# Patient Record
Sex: Male | Born: 1990 | Race: Black or African American | Hispanic: No | Marital: Single | Smoking: Former smoker
Health system: Southern US, Community
[De-identification: ages and names within clinical notes are randomized; demographics above are authoritative.]

## PROBLEM LIST (undated history)

## (undated) DIAGNOSIS — F4310 Post-traumatic stress disorder, unspecified: Secondary | ICD-10-CM

## (undated) DIAGNOSIS — F259 Schizoaffective disorder, unspecified: Secondary | ICD-10-CM

## (undated) DIAGNOSIS — G43909 Migraine, unspecified, not intractable, without status migrainosus: Secondary | ICD-10-CM

## (undated) HISTORY — PX: HAND SURGERY: SHX662

---
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
## 2013-04-29 ENCOUNTER — Emergency Department (HOSPITAL_BASED_OUTPATIENT_CLINIC_OR_DEPARTMENT_OTHER)
Admission: EM | Admit: 2013-04-29 | Discharge: 2013-04-29 | Disposition: A | Discharge status: Home / Self Care | Payer: TRICARE For Life (TFL) | Attending: Emergency Medicine | Admitting: Emergency Medicine

## 2013-04-29 ENCOUNTER — Encounter (HOSPITAL_BASED_OUTPATIENT_CLINIC_OR_DEPARTMENT_OTHER): Payer: Self-pay | Admitting: Emergency Medicine

## 2013-04-29 ENCOUNTER — Emergency Department (HOSPITAL_BASED_OUTPATIENT_CLINIC_OR_DEPARTMENT_OTHER): Payer: TRICARE For Life (TFL)

## 2013-04-29 DIAGNOSIS — R42 Dizziness and giddiness: Secondary | ICD-10-CM | POA: Insufficient documentation

## 2013-04-29 DIAGNOSIS — Z79899 Other long term (current) drug therapy: Secondary | ICD-10-CM | POA: Insufficient documentation

## 2013-04-29 DIAGNOSIS — R079 Chest pain, unspecified: Secondary | ICD-10-CM

## 2013-04-29 DIAGNOSIS — R05 Cough: Secondary | ICD-10-CM | POA: Insufficient documentation

## 2013-04-29 DIAGNOSIS — R059 Cough, unspecified: Secondary | ICD-10-CM | POA: Insufficient documentation

## 2013-04-29 DIAGNOSIS — J40 Bronchitis, not specified as acute or chronic: Secondary | ICD-10-CM

## 2013-04-29 DIAGNOSIS — Z8679 Personal history of other diseases of the circulatory system: Secondary | ICD-10-CM | POA: Insufficient documentation

## 2013-04-29 HISTORY — DX: Migraine, unspecified, not intractable, without status migrainosus: G43.909

## 2013-04-29 LAB — CBC
HCT: 45.2 % (ref 39.0–52.0)
Hemoglobin: 16 g/dL (ref 13.0–17.0)
MCH: 30.8 pg (ref 26.0–34.0)
MCHC: 35.4 g/dL (ref 30.0–36.0)
MCV: 86.9 fL (ref 78.0–100.0)
Platelets: 249 10*3/uL (ref 150–400)
RBC: 5.2 MIL/uL (ref 4.22–5.81)
RDW: 12.9 % (ref 11.5–15.5)
WBC: 10.3 10*3/uL (ref 4.0–10.5)

## 2013-04-29 LAB — BASIC METABOLIC PANEL
BUN: 11 mg/dL (ref 6–23)
CO2: 25 mEq/L (ref 19–32)
Creatinine, Ser: 1 mg/dL (ref 0.50–1.35)
GFR calc non Af Amer: >90 mL/min (ref >90)
Glucose, Bld: 82 mg/dL (ref 70–99)
Potassium: 3.8 mEq/L (ref 3.5–5.1)
Sodium: 140 mEq/L (ref 135–145)

## 2013-04-29 LAB — D-DIMER, QUANTITATIVE: D-Dimer, Quant: <0.27 ug/mL-FEU (ref 0.00–0.48)

## 2013-04-29 MED ORDER — ALBUTEROL SULFATE HFA 108 (90 BASE) MCG/ACT IN AERS: 2.0000 | INHALATION_SPRAY | RESPIRATORY_TRACT | Status: DC | PRN

## 2013-04-29 MED ORDER — IBUPROFEN 800 MG PO TABS
800.0000 mg | ORAL_TABLET | Freq: Once | ORAL | Status: AC
  Administered 2013-04-29: 800 mg via ORAL
  Filled 2013-04-29: qty 1

## 2013-04-29 NOTE — ED Notes (Addendum)
Pt c/o left upper chest pain and left upper arm pain described as "burning" since 5pm after lifting boxes and coughing. Pt reports coughing up "blood" today. Pt reports h/o chest pain. EKG obtained in triage.

## 2013-04-29 NOTE — ED Provider Notes (Signed)
CSN: [id number]     Arrival date & time 04/29/13  4098 History  This chart was scribed for Ethelda Chick, MD by Joaquin Music, ED Scribe. This patient was seen in room MH12/MH12 and the patient's care was started at 7:04 PM  Chief Complaint  Patient presents with  . Chest Pain   Patient is a 22 y.o. male presenting with chest pain. The history is provided by the patient. No language interpreter was used.  Chest Pain Pain location:  L chest Pain quality: aching, radiating, stabbing, throbbing and tightness   Pain radiates to:  L arm Pain radiates to the back: no   Pain severity:  Moderate Onset quality:  Sudden Duration:  1 week Timing:  Sporadic Progression:  Worsening Chronicity:  New Context: not breathing, no drug use, no stress and no trauma   Relieved by: Water. Worsened by:  Nothing tried Ineffective treatments:  None tried Associated symptoms: cough and dizziness   Associated symptoms: no abdominal pain, no back pain, no fever, no headache, no numbness, no shortness of breath and no weakness   Risk factors: no birth control, no diabetes mellitus, no high cholesterol and no hypertension    HPI Comments: Justin Mosley is a 22 y.o. male with a hx of CP who presents to the Emergency Department complaining of ongoing chest pain for the past week but has worsened today and radiates down L arm. Pt states "he feels like electricity is running through his body". Pt states when the pain began, he was doing minimal work. He states his CP today felt "like something being tugged on". He rates his current pain 5/10. Pt states drinking water gives him some relief. He states he began coughing about 2-3 days ago and reports seeing blood in sputum today. He states he has had previous episodes of CP but has never been diagnosed. He states he was given Prilosec for his last episode and reports having minimal relief. Pt denies having fever and abd pain.Pt will be getting a PCP soon.Pt  denies taking OTC medications.  Pt states he recently moved and traveled by car for over 13 hours.   Past Medical History  Diagnosis Date  . Migraine    History reviewed. No pertinent past surgical history. No family history on file. History  Substance Use Topics  . Smoking status: Never Smoker   . Smokeless tobacco: Not on file  . Alcohol Use: No    Review of Systems  Constitutional: Negative for fever.  Respiratory: Positive for cough. Negative for shortness of breath.   Cardiovascular: Positive for chest pain.  Gastrointestinal: Negative for abdominal pain.  Musculoskeletal: Negative for back pain.  Neurological: Positive for dizziness. Negative for weakness, numbness and headaches.  All other systems reviewed and are negative.    Allergies  Review of patient's allergies indicates no known allergies.  Home Medications   Current Outpatient Rx  Name  Route  Sig  Dispense  Refill  . albuterol (PROVENTIL HFA;VENTOLIN HFA) 108 (90 BASE) MCG/ACT inhaler   Inhalation   Inhale 2 puffs into the lungs every 4 (four) hours as needed for wheezing or shortness of breath.   1 Inhaler   0     Triage Vitals:BP 128/77  Pulse 68  Temp(Src) 98.5 F (36.9 C) (Oral)  Resp 18  Ht 5\' 6"  (1.676 m)  Wt 140 lb (63.504 kg)  BMI 22.61 kg/m2  SpO2 100%  Physical Exam  Constitutional: He is oriented to person, place, and  time. He appears well-developed and well-nourished.  HENT:  Head: Normocephalic and atraumatic.  Right Ear: External ear normal.  Left Ear: External ear normal.  Mouth/Throat: Oropharynx is clear and moist.  Eyes: Pupils are equal, round, and reactive to light.  Cardiovascular: Normal rate, regular rhythm and normal heart sounds.   Pulmonary/Chest: Effort normal.  Musculoskeletal: He exhibits no edema.  No leg swelling.  Neurological: He is alert and oriented to person, place, and time.  Note- lungs CTA, no wheezes/rhonchi/rales Chest- no reproducible chest  tenderness, normal pulses ED Course  Procedures  DIAGNOSTIC STUDIES: Oxygen Saturation is 100% on RA, normal by my interpretation.    COORDINATION OF CARE: 7:06 PM-Discussed treatment plan which includes CXR, CBC, D-dimer, and EKG. Pt agreed to plan.   Labs Review Labs Reviewed  CBC  D-DIMER, QUANTITATIVE  BASIC METABOLIC PANEL   Imaging Review Dg Chest 2 View  04/29/2013   CLINICAL DATA:  Left-sided chest pain for 1 week  EXAM: CHEST  2 VIEW  COMPARISON:  None.  FINDINGS: The heart size and mediastinal contours are within normal limits. Both lungs are clear. The visualized skeletal structures are unremarkable.  IMPRESSION: No active cardiopulmonary disease.   Electronically Signed   By: Esperanza Heir M.D.   On: 04/29/2013 19:27    EKG Interpretation    Date/Time:  Monday April 29 2013 18:39:26 EST Ventricular Rate:  66 PR Interval:  116 QRS Duration: 74 QT Interval:  382 QTC Calculation: 400 R Axis:   25 Text Interpretation:  Normal sinus rhythm Normal ECG No old tracing to compare Confirmed by [facility]  MD, Micki Cassel [phone]) on 04/29/2013 11:21:51 PM            MDM   1. Chest pain   2. Bronchitis    Pt presenting with c/o chest pain and cough productive of small amount of blood.  EKG and CXR are both reassuring, CXR images reviewed and interpreted by me.  Will give albuterol MDI for bronchitis.  Discharged with strict return precautions.  Pt agreeable with plan.  I personally performed the services described in this documentation, which was scribed in my presence. The recorded information has been reviewed and is accurate.    Ethelda Chick, MD 05/01/13 [phone]

## 2013-04-29 NOTE — ED Notes (Signed)
Patient transported to X-ray 

## 2013-05-12 ENCOUNTER — Emergency Department (HOSPITAL_COMMUNITY)
Admission: EM | Admit: 2013-05-12 | Discharge: 2013-05-12 | Disposition: A | Discharge status: Home / Self Care | Payer: TRICARE For Life (TFL) | Attending: Emergency Medicine | Admitting: Emergency Medicine

## 2013-05-12 ENCOUNTER — Encounter (HOSPITAL_COMMUNITY): Payer: Self-pay | Admitting: Emergency Medicine

## 2013-05-12 ENCOUNTER — Emergency Department (HOSPITAL_COMMUNITY): Payer: TRICARE For Life (TFL)

## 2013-05-12 DIAGNOSIS — R079 Chest pain, unspecified: Secondary | ICD-10-CM

## 2013-05-12 DIAGNOSIS — R072 Precordial pain: Secondary | ICD-10-CM | POA: Insufficient documentation

## 2013-05-12 DIAGNOSIS — Z8679 Personal history of other diseases of the circulatory system: Secondary | ICD-10-CM | POA: Insufficient documentation

## 2013-05-12 MED ORDER — IBUPROFEN 400 MG PO TABS
600.0000 mg | ORAL_TABLET | Freq: Once | ORAL | Status: AC
  Administered 2013-05-12: 600 mg via ORAL
  Filled 2013-05-12 (×2): qty 1

## 2013-05-12 NOTE — ED Notes (Addendum)
Pt c/o left sided chest pain x 1 1/2 months. Pt has had a cough for past couple of weeks, pt was told he had bronchitis. Pt talking in complete sentences without difficulty. Pt also c/o insomnia for past couple of months that is causing a frontal headache.

## 2013-05-12 NOTE — ED Notes (Signed)
Patient discharged to home with family. NAD.  

## 2013-05-12 NOTE — ED Provider Notes (Signed)
CSN: [id number]     Arrival date & time 05/12/13  1459 History   First MD Initiated Contact with Patient 05/12/13 1603     Chief Complaint  Patient presents with  . Chest Pain   (Consider location/radiation/quality/duration/timing/severity/associated sxs/prior Treatment) HPI Comments: Seen multiple doctors for this chest pain previously, no answers provided throughout his childhood.  Patient is a 22 y.o. male presenting with chest pain. The history is provided by the patient.  Chest Pain Pain location:  Substernal area and L chest Pain quality: aching and dull   Pain radiates to:  Does not radiate Pain radiates to the back: no   Pain severity:  Mild Onset quality:  Gradual Timing:  Intermittent Progression:  Unchanged Chronicity:  Chronic (been present since he was a kid) Context: not breathing and no drug use   Relieved by:  Nothing Worsened by:  Nothing tried Associated symptoms: no abdominal pain, no cough, no fever, no shortness of breath and not vomiting     Past Medical History  Diagnosis Date  . Migraine    Past Surgical History  Procedure Laterality Date  . Hand surgery     No family history on file. History  Substance Use Topics  . Smoking status: Never Smoker   . Smokeless tobacco: Not on file  . Alcohol Use: No    Review of Systems  Constitutional: Negative for fever.  Respiratory: Negative for cough and shortness of breath.   Cardiovascular: Positive for chest pain.  Gastrointestinal: Negative for vomiting and abdominal pain.  All other systems reviewed and are negative.    Allergies  Review of patient's allergies indicates no known allergies.  Home Medications   Current Outpatient Rx  Name  Route  Sig  Dispense  Refill  . albuterol (PROVENTIL HFA;VENTOLIN HFA) 108 (90 BASE) MCG/ACT inhaler   Inhalation   Inhale 1-2 puffs into the lungs every 6 (six) hours as needed for wheezing or shortness of breath.         Marland Kitchen  aspirin-acetaminophen-caffeine (EXCEDRIN MIGRAINE) 250-250-65 MG per tablet   Oral   Take 2 tablets by mouth every 6 (six) hours as needed for headache.          BP 155/97  Pulse 81  Temp(Src) 98.2 F (36.8 C) (Oral)  Resp 18  Ht 5\' 6"  (1.676 m)  Wt 140 lb (63.504 kg)  BMI 22.61 kg/m2  SpO2 100% Physical Exam  Nursing note and vitals reviewed. Constitutional: He is oriented to person, place, and time. He appears well-developed and well-nourished. No distress.  HENT:  Head: Normocephalic and atraumatic.  Mouth/Throat: No oropharyngeal exudate.  Eyes: EOM are normal. Pupils are equal, round, and reactive to light.  Neck: Normal range of motion. Neck supple.  Cardiovascular: Normal rate and regular rhythm.  Exam reveals no friction rub.   No murmur heard. Pulmonary/Chest: Effort normal and breath sounds normal. No respiratory distress. He has no wheezes. He has no rales. He exhibits no tenderness.  Abdominal: He exhibits no distension. There is no tenderness. There is no rebound.  Musculoskeletal: Normal range of motion. He exhibits no edema.  Neurological: He is alert and oriented to person, place, and time.  Skin: He is not diaphoretic.    ED Course  Procedures (including critical care time) Labs Review Labs Reviewed - No data to display Imaging Review No results found.  EKG Interpretation    Date/Time:  Sunday May 12 2013 15:04:01 EST Ventricular Rate:  70 PR Interval:  116 QRS Duration: 72 QT Interval:  340 QTC Calculation: 367 R Axis:   32 Text Interpretation:  Normal sinus rhythm Normal ECG Confirmed by Gwendolyn Grant  MD, Jerami Tammen ([id number]) on 05/12/2013 4:04:49 PM            MDM   1. Chest pain    83M here with chest pain. Multiple visits for same. He states he's had this chest pain since he was a kid. He's been on Prilosec with no relief. He occasionally takes ibuprofen without relief. He describes as a central dull ache. He states it does get better when  he walks around he eats. Here vitals are stable, is relaxing currently in sinus rhythm on the monitor and normal pulse oximeter 100% on room air. Exam is benign. No chest wall tenderness. He is nontoxic. He was diagnosed with bronchitis when he presented with this a few weeks ago. His cough is decreased since then. I do not think he has bronchitis today. We will check his chest x-ray. EKG is normal. He is also complaining of seat problems. He reports some thought-racing and an inability to go to sleep at night. I recommended good sleep hygiene and starting a sleep routine and also try low-dose Benadryl. CXR normal. Stable for discharge.  I have reviewed all labs and imaging and considered them in my medical decision making.   Dagmar Hait, MD 05/12/13 [phone]

## 2013-05-12 NOTE — ED Notes (Signed)
Patient transported to X-ray 

## 2014-05-19 ENCOUNTER — Emergency Department (HOSPITAL_BASED_OUTPATIENT_CLINIC_OR_DEPARTMENT_OTHER)
Admission: EM | Admit: 2014-05-19 | Discharge: 2014-05-19 | Disposition: A | Discharge status: Home / Self Care | Payer: Managed Care, Other (non HMO) | Attending: Emergency Medicine | Admitting: Emergency Medicine

## 2014-05-19 ENCOUNTER — Encounter (HOSPITAL_BASED_OUTPATIENT_CLINIC_OR_DEPARTMENT_OTHER): Payer: Self-pay | Admitting: *Deleted

## 2014-05-19 DIAGNOSIS — Z8679 Personal history of other diseases of the circulatory system: Secondary | ICD-10-CM | POA: Insufficient documentation

## 2014-05-19 DIAGNOSIS — G4700 Insomnia, unspecified: Secondary | ICD-10-CM | POA: Diagnosis not present

## 2014-05-19 DIAGNOSIS — Z79899 Other long term (current) drug therapy: Secondary | ICD-10-CM | POA: Insufficient documentation

## 2014-05-19 NOTE — Discharge Instructions (Signed)
You may try to take melatonin over-the-counter. Follow-up with one of the resources below to establish care with a primary care physician.  RESOURCE GUIDE  Chronic Pain Problems: Contact [facility]  [phone] Patients need to be referred by their primary care doctor.  Insufficient Money for Medicine: Contact [employer]:  call "211."   No Primary Care Doctor: - Call [facility]  [phone] - can help you locate a primary care doctor that  accepts your insurance, provides certain services, etc. - Physician Referral Service- [phone]  Agencies that provide inexpensive medical care: - [facility]  [phone] - [facility]  [phone] - [facility]  [phone] - [facility]  [phone] - [facility]  [phone] - [facility]  [phone]  Medicaid-accepting [facility] Providers: - [facility]- [address]  [phone], Mon-Fri 9am-7pm, Sat 9am-1pm - [facility]- [address] [place], [address] [place]  [phone] - [facility]- [address] [place]  [phone] [facility]- [address]  [phone] - Renaye Rakers- [address] [place], [address], [phone]  Only accepts [place] patients after they have their name  applied to their card  Self Pay (no insurance) in [place]: - Sickle Cell Patients: Dr Willey Blade, [facility]  [address] [place], [phone] - [facility]- [address] [place]  [phone]       [facility] [place]- [address]       -     [facility]- see information above (Speak to [employer] if you do not have insurance)       -  [facility]- [address],  [phone]       -  [facility]- [address], [phone]       -  Dr Julio Sicks-  [address], [place],  [phone]       -  [facility] - [address] [phone]       -  [facility]- [address] [phone], also [address] [phone]       -    [facility]- [address] [place], [phone], 1st & 3rd Saturday        every month, 10am-1pm  1) Find a Doctor and Pay Out of Pocket Although you won't have to find out who is covered by your insurance plan, it is a good idea to ask around and get recommendations. You will then need to call the office and see if the doctor you have chosen will accept you as a new patient and what types of options they offer for patients who are self-pay. Some doctors offer discounts or will set up payment plans for their patients who do not have insurance, but you will need to ask so you aren't surprised when you get to your appointment.  2) Contact Your Local Health Department Not all health departments have doctors that can see patients for sick visits, but many do, so it is worth a call to see if yours does. If you don't know where your local health department is, you can check in your phone book. The CDC also has a tool to help you locate your  state's health department, and many state websites also have listings of all of their local health departments.  3) Find a Walk-in Clinic If your illness is not likely to be very severe or complicated, you may want to try a walk in clinic. These are popping up all over the country in pharmacies, drugstores, and shopping centers. They're usually staffed by nurse practitioners or physician assistants that have been trained to treat common illnesses and complaints. They're usually fairly quick and inexpensive. However, if you have serious medical issues or chronic medical problems, these are probably not your best option  Insomnia Insomnia is frequent trouble falling and/or staying asleep. Insomnia can be a long term problem or a short term problem. Both are common.  Insomnia can be a short term problem when the wakefulness is related to a certain stress or worry. Long term insomnia is often related to ongoing stress during waking hours and/or poor sleeping habits. Overtime, sleep deprivation itself can make the problem worse. Every little thing feels more severe because you are overtired and your ability to cope is decreased. CAUSES   Stress, anxiety, and depression.  Poor sleeping habits.  Distractions such as TV in the bedroom.  Naps close to bedtime.  Engaging in emotionally charged conversations before bed.  Technical reading before sleep.  Alcohol and other sedatives. They may make the problem worse. They can hurt normal sleep patterns and normal dream activity.  Stimulants such as caffeine for several hours prior to bedtime.  Pain syndromes and shortness of breath can cause insomnia.  Exercise late at night.  Changing time zones may cause sleeping problems (jet lag). It is sometimes helpful to have someone observe your sleeping patterns. They should look for periods of not breathing during the night (sleep apnea). They should also look to see how long those periods last. If you live alone or observers are uncertain, you can also be observed at a sleep clinic where your sleep patterns will be professionally monitored. Sleep apnea requires a checkup and treatment. Give your caregivers your medical history. Give your caregivers observations your family has made about your sleep.  SYMPTOMS   Not feeling rested in the morning.  Anxiety and restlessness at bedtime.  Difficulty falling and staying asleep. TREATMENT   Your caregiver may prescribe treatment for an underlying medical disorders. Your caregiver can give advice or help if you are using alcohol or other drugs for self-medication. Treatment of underlying problems will usually eliminate insomnia problems.  Medications can be prescribed for short time use. They are generally not  recommended for lengthy use.  Over-the-counter sleep medicines are not recommended for lengthy use. They can be habit forming.  You can promote easier sleeping by making lifestyle changes such as:  Using relaxation techniques that help with breathing and reduce muscle tension.  Exercising earlier in the day.  Changing your diet and the time of your last meal. No night time snacks.  Establish a regular time to go to bed.  Counseling can help with stressful problems and worry.  Soothing music and white noise may be helpful if there are background noises you cannot remove.  Stop tedious detailed work at least one hour before bedtime. HOME CARE INSTRUCTIONS   Keep a diary. Inform your caregiver about your progress. This includes any medication side effects. See your caregiver regularly. Take note of:  Times when you are asleep.  Times when you are awake during the night.  The quality of your sleep.  How  you feel the next day. This information will help your caregiver care for you.  Get out of bed if you are still awake after 15 minutes. Read or do some quiet activity. Keep the lights down. Wait until you feel sleepy and go back to bed.  Keep regular sleeping and waking hours. Avoid naps.  Exercise regularly.  Avoid distractions at bedtime. Distractions include watching television or engaging in any intense or detailed activity like attempting to balance the household checkbook.  Develop a bedtime ritual. Keep a familiar routine of bathing, brushing your teeth, climbing into bed at the same time each night, listening to soothing music. Routines increase the success of falling to sleep faster.  Use relaxation techniques. This can be using breathing and muscle tension release routines. It can also include visualizing peaceful scenes. You can also help control troubling or intruding thoughts by keeping your mind occupied with boring or repetitive thoughts like the old concept of  counting sheep. You can make it more creative like imagining planting one beautiful flower after another in your backyard garden.  During your day, work to eliminate stress. When this is not possible use some of the previous suggestions to help reduce the anxiety that accompanies stressful situations. MAKE SURE YOU:   Understand these instructions.  Will watch your condition.  Will get help right away if you are not doing well or get worse. Document Released: 05/13/2000 Document Revised: 08/08/2011 Document Reviewed: 06/13/2007 [facility] Patient Information 2015 [employer] [place]. This information is not intended to replace advice given to you by your health care provider. Make sure you discuss any questions you have with your health care provider.

## 2014-05-19 NOTE — ED Notes (Signed)
Unable to sleep for the past 2 months.

## 2014-05-19 NOTE — ED Provider Notes (Signed)
CSN: [id number]     Arrival date & time 05/19/14  1133 History   First MD Initiated Contact with Patient 05/19/14 1151     Chief Complaint  Patient presents with  . Insomnia     (Consider location/radiation/quality/duration/timing/severity/associated sxs/prior Treatment) HPI Comments: 23 year old male presenting with insomnia for the past month and a half. Patient reports he's been dealing with insomnia for many years, however it's been worsening over the past month and a half. He states he will follow asleep when he is tired, however immediately wake up. When he does sleep, he gets bad nightmares. He states this is beginning to affect his daily work. States he has been doing with slight depression over the past 2 years, however denies suicidal or homicidal ideations. He does not have a PCP. He mentioned his insomnia at a prior ED visit, was told to take a low-dose Benadryl at the time. He states Benadryl does not help. He has tried taking melatonin in the past which helps for short while, however his body then "became immune to it". Denies fever, chills, chest pain, sob.  The history is provided by the patient.    Past Medical History  Diagnosis Date  . Migraine    Past Surgical History  Procedure Laterality Date  . Hand surgery     No family history on file. History  Substance Use Topics  . Smoking status: Never Smoker   . Smokeless tobacco: Not on file  . Alcohol Use: No    Review of Systems  10 Systems reviewed and are negative for acute change except as noted in the HPI.  Allergies  Review of patient's allergies indicates no known allergies.  Home Medications   Prior to Admission medications   Medication Sig Start Date End Date Taking? Authorizing Provider  albuterol (PROVENTIL HFA;VENTOLIN HFA) 108 (90 BASE) MCG/ACT inhaler Inhale 1-2 puffs into the lungs every 6 (six) hours as needed for wheezing or shortness of breath.    Historical Provider, MD   aspirin-acetaminophen-caffeine (EXCEDRIN MIGRAINE) [phone] MG per tablet Take 2 tablets by mouth every 6 (six) hours as needed for headache.    Historical Provider, MD   BP 152/95 mmHg  Pulse 83  Temp(Src) 98 F (36.7 C) (Oral)  Resp 20  Ht 5\' 6"  (1.676 m)  Wt 140 lb (63.504 kg)  BMI 22.61 kg/m2  SpO2 100% Physical Exam  Constitutional: He is oriented to person, place, and time. He appears well-developed and well-nourished. No distress.  HENT:  Head: Normocephalic and atraumatic.  Mouth/Throat: Oropharynx is clear and moist.  Eyes: Conjunctivae are normal.  Neck: Normal range of motion. Neck supple.  Cardiovascular: Normal rate, regular rhythm and normal heart sounds.   Pulmonary/Chest: Effort normal and breath sounds normal.  Musculoskeletal: Normal range of motion. He exhibits no edema.  Neurological: He is alert and oriented to person, place, and time.  Skin: Skin is warm and dry. He is not diaphoretic.  Psychiatric: He has a normal mood and affect. His behavior is normal.  Nursing note and vitals reviewed.   ED Course  Procedures (including critical care time) Labs Review Labs Reviewed - No data to display  Imaging Review No results found.   EKG Interpretation None      MDM   Final diagnoses:  Insomnia   Patient in no apparent distress. He has been dealing with insomnia for many years. I advised him to try melatonin. Resources given for PCP follow-up for further discussion. Stable for discharge.  Return precautions given. Patient states understanding of treatment care plan and is agreeable.  Kathrynn SpeedRobyn M Addisyn Leclaire, PA-C 05/19/14 1218  Geoffery Lyonsouglas Delo, MD 05/19/14 [phone]

## 2018-03-31 ENCOUNTER — Encounter (HOSPITAL_COMMUNITY): Payer: Self-pay | Admitting: *Deleted

## 2018-03-31 ENCOUNTER — Emergency Department (HOSPITAL_COMMUNITY)
Admission: EM | Admit: 2018-03-31 | Discharge: 2018-03-31 | Disposition: A | Discharge status: Home / Self Care | Payer: Managed Care, Other (non HMO) | Attending: Emergency Medicine | Admitting: Emergency Medicine

## 2018-03-31 DIAGNOSIS — R0789 Other chest pain: Secondary | ICD-10-CM | POA: Insufficient documentation

## 2018-03-31 DIAGNOSIS — R45851 Suicidal ideations: Secondary | ICD-10-CM | POA: Insufficient documentation

## 2018-03-31 LAB — I-STAT TROPONIN, ED: Troponin i, poc: 0 ng/mL (ref 0.00–0.08)

## 2018-03-31 NOTE — ED Provider Notes (Signed)
[facility]   CSN: [id number] Arrival date & time: 03/31/18  1624     History   Chief Complaint Chief Complaint  Patient presents with  . Suicidal  . Chest Pain    HPI Justin Mosley is a 27 y.o. male.  27 year old male history of psychiatric disease and migraines here after he presented to [facility] on an IVC.  States he was thinking about hurting himself.  He tells me he is looking to get back on his psychiatric medicines.  At [facility] he complained of some sharp stabbing chest pain and so they transferred him here for evaluation.  It sounds like they want him back to [facility] after we medically clear him.  As far as the chest pain he says is been going on since he was about 27 years old, last a few seconds at a time and does not seem to be related to anything that he does.  He denies any cocaine.  No family history of early cardiac death.  He denies any chest pain currently.  The history is provided by the patient.  Chest Pain   This is a chronic problem. Episode onset: 18 years ago. The problem occurs daily. The problem has not changed since onset.Associated with: unknown. The pain is present in the substernal region. The pain is at a severity of 1/10. The quality of the pain is described as brief and sharp. The pain does not radiate. Duration of episode(s) is 4 seconds. Pertinent negatives include no abdominal pain, no back pain, no cough, no diaphoresis, no dizziness, no exertional chest pressure, no fever, no headaches, no hemoptysis, no nausea, no shortness of breath and no vomiting. He has tried nothing for the symptoms. Risk factors include male gender and smoking/tobacco exposure.  Pertinent negatives for past medical history include no CAD and no PE.  Pertinent negatives for family medical history include: no early MI.    Past Medical History:  Diagnosis Date  . Migraine     There are no active problems to display for this  patient.   Past Surgical History:  Procedure Laterality Date  . HAND SURGERY          Home Medications    Prior to Admission medications   Medication Sig Start Date End Date Taking? Authorizing Provider  albuterol (PROVENTIL HFA;VENTOLIN HFA) 108 (90 BASE) MCG/ACT inhaler Inhale 1-2 puffs into the lungs every 6 (six) hours as needed for wheezing or shortness of breath.    [provider]  aspirin-acetaminophen-caffeine (EXCEDRIN MIGRAINE) [phone] MG per tablet Take 2 tablets by mouth every 6 (six) hours as needed for headache.    [provider]    Family History No family history on file.  Social History Social History   Tobacco Use  . Smoking status: Never Smoker  Substance Use Topics  . Alcohol use: No  . Drug use: No     Allergies   Patient has no known allergies.   Review of Systems Review of Systems  Constitutional: Negative for diaphoresis and fever.  HENT: Negative for sore throat.   Eyes: Negative for visual disturbance.  Respiratory: Negative for cough, hemoptysis and shortness of breath.   Cardiovascular: Positive for chest pain.  Gastrointestinal: Negative for abdominal pain, nausea and vomiting.  Genitourinary: Negative for dysuria.  Musculoskeletal: Negative for back pain.  Skin: Negative for rash.  Neurological: Negative for dizziness and headaches.     Physical Exam Updated Vital Signs BP Marland Kitchen)  147/100 (BP Location: Left Arm)   Pulse 65   Temp 98 F (36.7 C) (Oral)   Resp 18   SpO2 100%   Physical Exam  Constitutional: He appears well-developed and well-nourished.  HENT:  Head: Normocephalic and atraumatic.  Eyes: Conjunctivae are normal.  Neck: Neck supple.  Cardiovascular: Normal rate, regular rhythm and normal pulses.  No murmur heard. Pulmonary/Chest: Effort normal and breath sounds normal. No respiratory distress.  Abdominal: Soft. There is no tenderness.  Musculoskeletal: He exhibits no edema.       Right  lower leg: He exhibits no tenderness and no edema.       Left lower leg: He exhibits no tenderness and no edema.  Neurological: He is alert.  Skin: Skin is warm and dry. Capillary refill takes less than 2 seconds.  Psychiatric: He has a normal mood and affect.  Nursing note and vitals reviewed.    ED Treatments / Results  Labs (all labs ordered are listed, but only abnormal results are displayed) Labs Reviewed  I-STAT TROPONIN, ED    EKG EKG Interpretation  Date/Time:  Saturday March 31 2018 16:37:26 EDT Ventricular Rate:  67 PR Interval:    QRS Duration: 82 QT Interval:  394 QTC Calculation: 416 R Axis:   39 Text Interpretation:  Sinus rhythm ST elev, probable normal early repol pattern similar pattern to prior 12/14 Confirmed by Meridee Score [phone]) on 03/31/2018 4:42:37 PM   Radiology No results found.  Procedures Procedures (including critical care time)  Medications Ordered in ED Medications - No data to display   Initial Impression / Assessment and Plan / ED Course  I have reviewed the triage vital signs and the nursing notes.  Pertinent labs & imaging results that were available during my care of the patient were reviewed by me and considered in my medical decision making (see chart for details).  Clinical Course as of Apr 02 1255  Sat Mar 31, 2018  1710 Patient's troponin is 0.00.  His pain is very atypical and is been going on a long time.  His blood pressure was slightly elevated on arrival but is normalized since his been here.  I do not see any reason why he could not be returned back to [facility] to have an evaluation for suicidal ideation.  Patient is medically cleared.   [MB]    Clinical Course User Index [MB] Terrilee Files, MD     Final Clinical Impressions(s) / ED Diagnoses   Final diagnoses:  Suicidal ideation  Atypical chest pain    ED Discharge Orders    None       Terrilee Files, MD 04/01/18 1256

## 2018-03-31 NOTE — ED Notes (Signed)
Bed: [facility]PT3 Expected date:  Expected time:  Means of arrival:  Comments: 

## 2018-03-31 NOTE — ED Notes (Signed)
This nurse spoke to [facility], pt is to return to them once he is medically cleared. Police to transport pt.

## 2018-03-31 NOTE — Discharge Instructions (Addendum)
You were seen in the emergency department for chest pain that has been going on for many years.  Your EKG and blood work did not show any signs of heart damage.  It will be important for you to follow-up with your doctors.  We are allowing you to return to [facility] for further evaluation of your suicidal thoughts.  Please return if any concerns.

## 2018-03-31 NOTE — ED Triage Notes (Signed)
Pt sent from [facility] d/t chest pain. Pt went to [facility] for [facility] with plan to jump off a bridge.

## 2018-07-18 ENCOUNTER — Emergency Department (HOSPITAL_COMMUNITY): Payer: Self-pay

## 2018-07-18 ENCOUNTER — Other Ambulatory Visit: Payer: Self-pay

## 2018-07-18 ENCOUNTER — Encounter (HOSPITAL_COMMUNITY): Payer: Self-pay

## 2018-07-18 ENCOUNTER — Emergency Department (HOSPITAL_COMMUNITY)
Admission: EM | Admit: 2018-07-18 | Discharge: 2018-07-18 | Disposition: A | Discharge status: Home / Self Care | Payer: Self-pay | Attending: Emergency Medicine | Admitting: Emergency Medicine

## 2018-07-18 DIAGNOSIS — B9789 Other viral agents as the cause of diseases classified elsewhere: Secondary | ICD-10-CM | POA: Insufficient documentation

## 2018-07-18 DIAGNOSIS — J069 Acute upper respiratory infection, unspecified: Secondary | ICD-10-CM | POA: Insufficient documentation

## 2018-07-18 DIAGNOSIS — Z79899 Other long term (current) drug therapy: Secondary | ICD-10-CM | POA: Insufficient documentation

## 2018-07-18 MED ORDER — IPRATROPIUM-ALBUTEROL 0.5-2.5 (3) MG/3ML IN SOLN
3.0000 mL | Freq: Once | RESPIRATORY_TRACT | Status: AC
  Administered 2018-07-18: 3 mL via RESPIRATORY_TRACT
  Filled 2018-07-18: qty 3

## 2018-07-18 MED ORDER — ALBUTEROL SULFATE HFA 108 (90 BASE) MCG/ACT IN AERS: 1.0000 | INHALATION_SPRAY | Freq: Four times a day (QID) | RESPIRATORY_TRACT | 0 refills | Status: DC | PRN

## 2018-07-18 NOTE — ED Triage Notes (Signed)
Patient c/o a productive cough with yellow sputum x 4 days. Patient states he has been taking Dayquil/Nyquil and took  Mucinex last night.

## 2018-07-18 NOTE — Discharge Instructions (Addendum)
Your x-ray today was normal.  Please continue to take Mucinex to help with your cough.  I have also provided an inhaler which should help with your breathing.  You may alternate ibuprofen or Tylenol for fever.  Please follow-up with your primary care physician as needed.

## 2018-07-18 NOTE — ED Provider Notes (Signed)
[facility] Provider Note   CSN: [id number] Arrival date & time: 07/18/18  1258    History   Chief Complaint Chief Complaint  Patient presents with  . Cough    HPI Justin Mosley is a 28 y.o. male.     28 y.o male with a PMH of Migraines presents to the ED with a chief complaint of cough, body aches and chills x 4 days.  Patient reports he began feeling ill while at work on Saturday, states waking up Sunday morning drenched in sweat, does report a subjective fever.  Has been taking DayQuil and NyQuil with no improvement in symptoms.  Does report a productive cough with yellow sputum.  Patient used to be a heavy smoker and recently quit in January.  Reports taking some Mucinex yesterday which help with his symptoms.  Also reports using inhaler a while back but has not been using one recently.  He denies any sick contacts, other complaints at this time.     Past Medical History:  Diagnosis Date  . Migraine     There are no active problems to display for this patient.   Past Surgical History:  Procedure Laterality Date  . HAND SURGERY          Home Medications    Prior to Admission medications   Medication Sig Start Date End Date Taking? Authorizing Provider  albuterol (PROVENTIL HFA;VENTOLIN HFA) 108 (90 Base) MCG/ACT inhaler Inhale 1-2 puffs into the lungs every 6 (six) hours as needed for wheezing or shortness of breath. 07/18/18   Claude Manges, PA-C  aspirin-acetaminophen-caffeine (EXCEDRIN MIGRAINE) [phone] MG per tablet Take 2 tablets by mouth every 6 (six) hours as needed for headache.    [provider]  lithium carbonate 300 MG capsule Take 300 mg by mouth 2 (two) times daily with a meal.    [provider]  QUEtiapine (SEROQUEL) 300 MG tablet Take 300 mg by mouth at bedtime.    [provider]    Family History Family History  Problem Relation Age of Onset  . Hypertension Mother   .  Hypertension Father   . Diabetes Father     Social History Social History   Tobacco Use  . Smoking status: Never Smoker  . Smokeless tobacco: Never Used  Substance Use Topics  . Alcohol use: No  . Drug use: No     Allergies   Coconut oil   Review of Systems Review of Systems  Respiratory: Positive for cough and wheezing. Negative for shortness of breath.      Physical Exam Updated Vital Signs BP (!) 147/100 (BP Location: Left Arm)   Pulse 81   Temp 98.6 F (37 C) (Oral)   Resp 16   Ht 5\' 6"  (1.676 m)   Wt 63.5 kg   SpO2 100%   BMI 22.60 kg/m   Physical Exam Vitals signs and nursing note reviewed.  Constitutional:      Appearance: He is well-developed.  HENT:     Head: Normocephalic and atraumatic.     Comments: Oropharynx appears erythematous.  No tonsillar exudate, no tonsillar abscess noted or edema.    Nose:     Comments: No frontal or maxillary sinus tenderness. Eyes:     General: No scleral icterus.    Pupils: Pupils are equal, round, and reactive to light.  Neck:     Musculoskeletal: Normal range of motion.  Cardiovascular:     Heart sounds: Normal heart sounds.  Pulmonary:     Effort: Pulmonary effort is normal.     Breath sounds: Examination of the left-upper field reveals decreased breath sounds. Decreased breath sounds and wheezing present.     Comments: Decreased breath sounds on left lobes, wheezing to the left upper lobe. Chest:     Chest wall: No tenderness.  Abdominal:     General: Bowel sounds are normal. There is no distension.     Palpations: Abdomen is soft.     Tenderness: There is no abdominal tenderness.  Musculoskeletal:        General: No tenderness or deformity.  Skin:    General: Skin is warm and dry.  Neurological:     Mental Status: He is alert and oriented to person, place, and time.      ED Treatments / Results  Labs (all labs ordered are listed, but only abnormal results are displayed) Labs Reviewed - No data  to display  EKG None  Radiology Dg Chest 2 View  Result Date: 07/18/2018 CLINICAL DATA:  Productive cough and dyspnea x3 days EXAM: CHEST - 2 VIEW COMPARISON:  05/12/2013 FINDINGS: The heart size and mediastinal contours are within normal limits. Both lungs are clear. The visualized skeletal structures are unremarkable. IMPRESSION: No active cardiopulmonary disease. Electronically Signed   By: Tollie Eth M.D.   On: 07/18/2018 14:37    Procedures Procedures (including critical care time)  Medications Ordered in ED Medications  ipratropium-albuterol (DUONEB) 0.5-2.5 (3) MG/3ML nebulizer solution 3 mL (3 mLs Nebulization Given 07/18/18 1415)     Initial Impression / Assessment and Plan / ED Course  I have reviewed the triage vital signs and the nursing notes.  Pertinent labs & imaging results that were available during my care of the patient were reviewed by me and considered in my medical decision making (see chart for details).       Patient presents with cough, body aches times a few days.  Has tried DayQuil NyQuil and Mucinex yesterday with some improvement in symptoms.  On evaluation there is slight wheezing noted to the left upper lung fields.  He does report a previous history of asthma and a heavy smoker who recently quit in January.  Chest x-ray ordered to rule out any acute process.  Chest x-ray showed no pneumonia, no acute process at this time.  Patient is afebrile during visit.  He was provided with 1 breathing treatment.  After recheck wheezing has resolved and improved.  We will send him home with inhaler as he has not had an inhaler since 2014.  Low suspicion for influenza as he is afebrile and very well-appearing during visit.  Does report he did not get a flu vaccine this season.  He is advised to push fluids, treat his fever at home and continue to take Mucinex which has helped with his symptoms.  Patient understands and agrees with management.  Return precautions  provided.  Final Clinical Impressions(s) / ED Diagnoses   Final diagnoses:  Viral URI with cough    ED Discharge Orders         Ordered    albuterol (PROVENTIL HFA;VENTOLIN HFA) 108 (90 Base) MCG/ACT inhaler  Every 6 hours PRN,   Status:  Discontinued     07/18/18 1445    albuterol (PROVENTIL HFA;VENTOLIN HFA) 108 (90 Base) MCG/ACT inhaler  Every 6 hours PRN     07/18/18 1505           Claude Manges, PA-C 07/18/18 1505  Geoffery Lyonselo, [place], MD 07/19/18 [phone]

## 2019-06-05 IMAGING — CR DG CHEST 2V
2 series · 2 of 2 positions shown · non-contrast
Comparison: 05/12/2013

CLINICAL DATA: Productive cough and dyspnea x3 days

EXAM:
CHEST - 2 VIEW

[w chest pa]
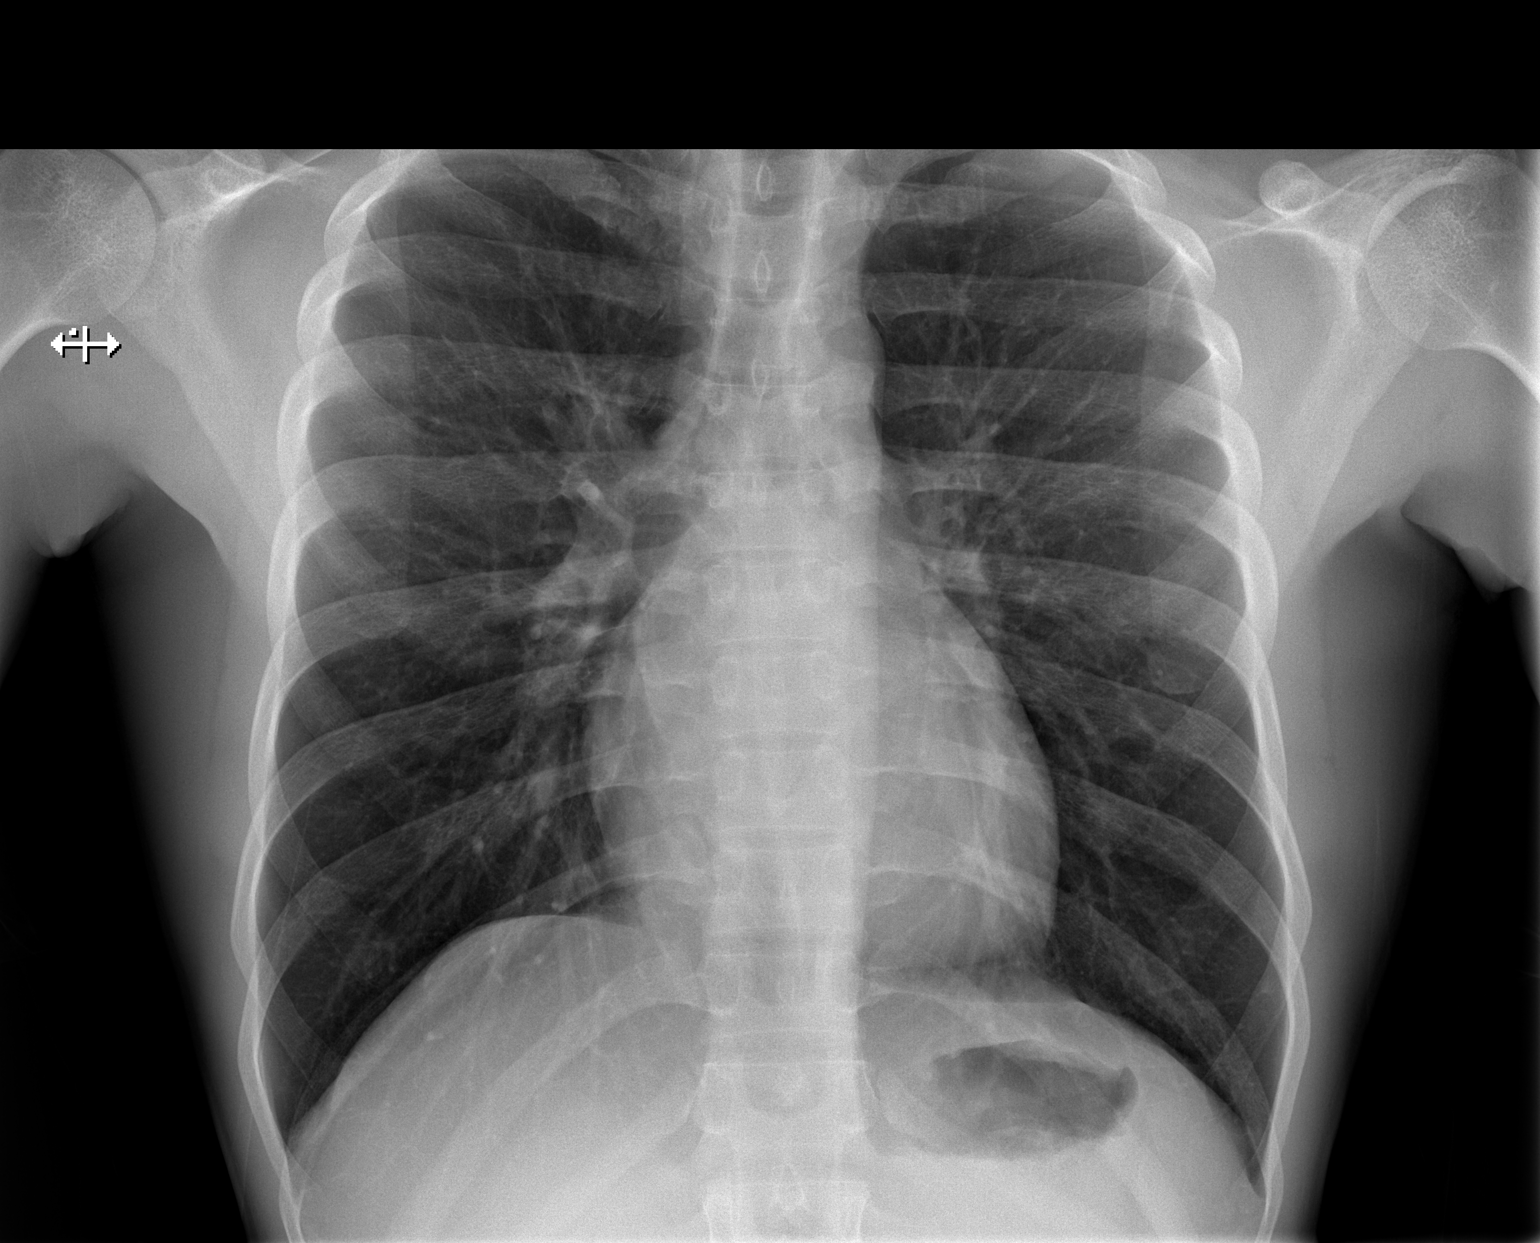

[w chest lat]
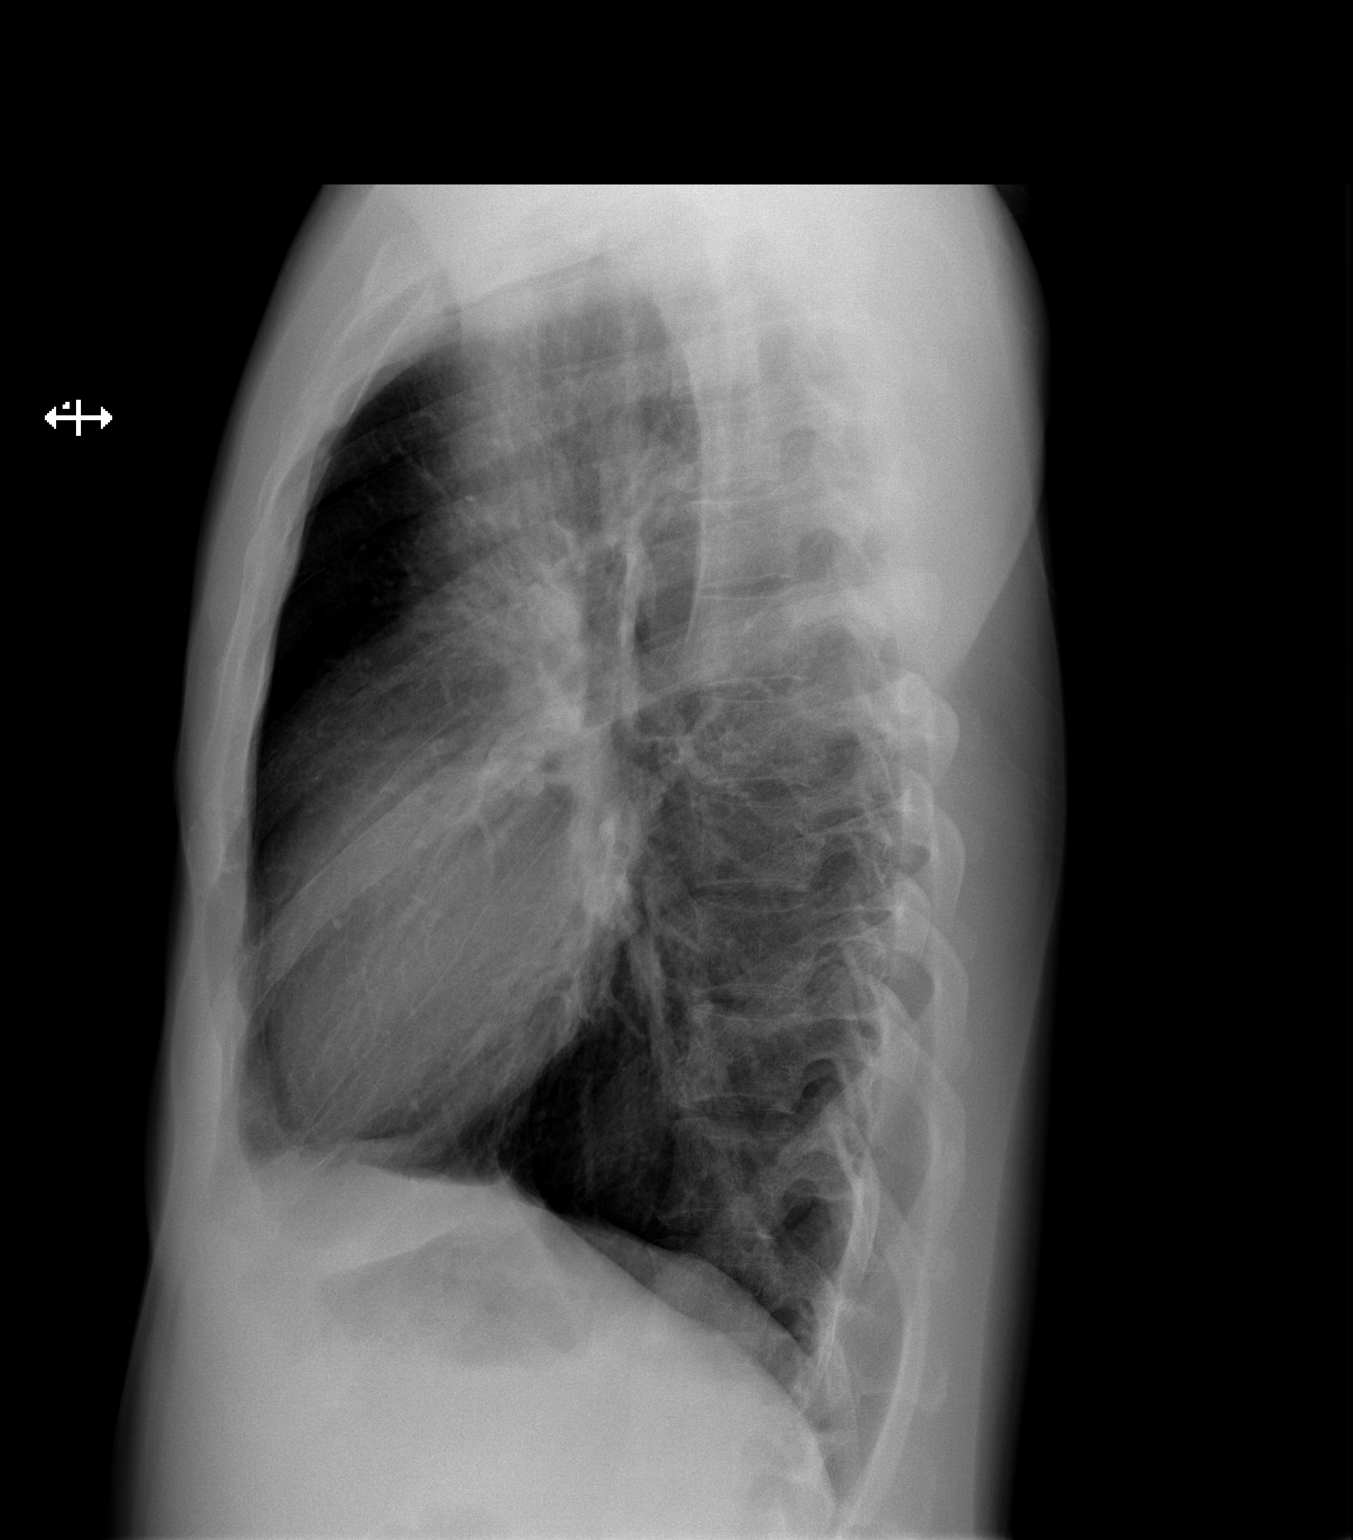

[2 of 2 positions shown; findings below may reference images not displayed]

FINDINGS: The heart size and mediastinal contours are within normal limits.
Both lungs are clear. The visualized skeletal structures are
unremarkable.
IMPRESSION: No active cardiopulmonary disease.

## 2021-01-01 ENCOUNTER — Emergency Department (HOSPITAL_COMMUNITY)
Admission: EM | Admit: 2021-01-01 | Discharge: 2021-01-01 | Disposition: A | Discharge status: Home / Self Care | Payer: Self-pay | Attending: Emergency Medicine | Admitting: Emergency Medicine

## 2021-01-01 ENCOUNTER — Other Ambulatory Visit: Payer: Self-pay

## 2021-01-01 ENCOUNTER — Encounter (HOSPITAL_COMMUNITY): Payer: Self-pay

## 2021-01-01 DIAGNOSIS — Z87891 Personal history of nicotine dependence: Secondary | ICD-10-CM | POA: Insufficient documentation

## 2021-01-01 DIAGNOSIS — L270 Generalized skin eruption due to drugs and medicaments taken internally: Secondary | ICD-10-CM | POA: Insufficient documentation

## 2021-01-01 LAB — CBC WITH DIFFERENTIAL/PLATELET
Abs Immature Granulocytes: 0.04 10*3/uL (ref 0.00–0.07)
Basophils Absolute: 0 10*3/uL (ref 0.0–0.1)
Basophils Relative: 0 %
Eosinophils Absolute: 0 10*3/uL (ref 0.0–0.5)
Eosinophils Relative: 0 %
HCT: 42.4 % (ref 39.0–52.0)
Hemoglobin: 14.6 g/dL (ref 13.0–17.0)
Immature Granulocytes: 0 %
Lymphocytes Relative: 13 %
Lymphs Abs: 1.7 10*3/uL (ref 0.7–4.0)
MCH: 30.7 pg (ref 26.0–34.0)
MCHC: 34.4 g/dL (ref 30.0–36.0)
MCV: 89.1 fL (ref 80.0–100.0)
Monocytes Absolute: 0.6 10*3/uL (ref 0.1–1.0)
Monocytes Relative: 4 %
Neutro Abs: 10.5 10*3/uL — ABNORMAL HIGH (ref 1.7–7.7)
Neutrophils Relative %: 83 %
Platelets: 236 10*3/uL (ref 150–400)
RBC: 4.76 MIL/uL (ref 4.22–5.81)
RDW: 13.2 % (ref 11.5–15.5)
WBC: 12.8 10*3/uL — ABNORMAL HIGH (ref 4.0–10.5)
nRBC: 0 % (ref 0.0–0.2)

## 2021-01-01 LAB — COMPREHENSIVE METABOLIC PANEL
ALT: 12 U/L (ref 0–44)
AST: 20 U/L (ref 15–41)
Albumin: 4.7 g/dL (ref 3.5–5.0)
Alkaline Phosphatase: 69 U/L (ref 38–126)
Anion gap: 8 (ref 5–15)
BUN: 9 mg/dL (ref 6–20)
CO2: 28 mmol/L (ref 22–32)
Calcium: 9.8 mg/dL (ref 8.9–10.3)
Chloride: 102 mmol/L (ref 98–111)
Creatinine, Ser: 0.97 mg/dL (ref 0.61–1.24)
GFR, Estimated: >60 mL/min (ref >60)
Glucose, Bld: 101 mg/dL — ABNORMAL HIGH (ref 70–99)
Potassium: 3.8 mmol/L (ref 3.5–5.1)
Sodium: 138 mmol/L (ref 135–145)
Total Bilirubin: 0.6 mg/dL (ref 0.3–1.2)
Total Protein: 8 g/dL (ref 6.5–8.1)

## 2021-01-01 NOTE — ED Provider Notes (Signed)
Emergency Medicine Provider Triage Evaluation Note  Justin Mosley , a 30 y.o. male  was evaluated in triage.  Pt complains of rash after starting Lamictal for mental health. Is pruritic. NO fevers. No tick bites. No recent sexual partners. No pain.   Review of Systems  Positive: Rash  Negative: Fevers, resp distress   Physical Exam  BP 138/72 (BP Location: Left Arm)   Pulse (!) 113   Temp 98 F (36.7 C) (Oral)   Resp 18   SpO2 97%  Gen:   Awake, no distress   Resp:  Normal effort  MSK:   Moves extremities without difficulty  Other:  Diffuse rash throughout, no urticaria, no resp distress. Tachycardic   Medical Decision Making  Medically screening exam initiated at 6:22 PM.  Appropriate orders placed.  Justin Mosley was informed that the remainder of the evaluation will be completed by another provider, this initial triage assessment does not replace that evaluation, and the importance of remaining in the ED until their evaluation is complete.     Farrel Gordon, PA-C 01/01/21 [name]    Alvira Monday, MD 01/03/21 [phone]

## 2021-01-01 NOTE — Discharge Instructions (Addendum)
Please STOP taking your Lamictal right away.  Contact a health care provider if you have: A fever. A rash that is not going away. A rash that gets worse. A rash that comes back. Wheezing or coughing. Get help right away if: You start to have breathing problems. You start to have shortness of breath. Your face or throat starts to swell. You have severe weakness with dizziness or fainting. You have chest pain.

## 2021-01-01 NOTE — ED Triage Notes (Signed)
Started Lamictal 1 week ago and generalized body rash 2 days after starting medicine that itches. Rash to arms, legs, and trunk.

## 2021-01-01 NOTE — ED Provider Notes (Signed)
[facility] Provider Note   CSN: [id number] Arrival date & time: 01/01/21  1733     History Chief Complaint  Patient presents with   Rash    Justin Mosley is a 30 y.o. male who presents to the ED with a cc of rash that developed 3 days ago. He started Lamictal 7 days ago.  He first noticed an erythematous rash on his lower extremities that progressed upward involves his arms and chest.  It is face sparing.  He does have palmar and plantar involvement and states that the rash is very itchy.  He denies any fevers, chills, body aches, skin pain.  He denies any mucosal involvement or conjunctival involvement.  He states it has been several months since he has had any intercourse or close intimate contact.  He has not taken anything for his discomfort.   Rash Associated symptoms: no fever       Past Medical History:  Diagnosis Date   Migraine     There are no problems to display for this patient.   Past Surgical History:  Procedure Laterality Date   HAND SURGERY         Family History  Problem Relation Age of Onset   Hypertension Mother    Hypertension Father    Diabetes Father     Social History   Tobacco Use   Smoking status: Never   Smokeless tobacco: Never  Vaping Use   Vaping Use: Former  Substance Use Topics   Alcohol use: No   Drug use: No    Home Medications Prior to Admission medications   Medication Sig Start Date End Date Taking? Authorizing Provider  albuterol (PROVENTIL HFA;VENTOLIN HFA) 108 (90 Base) MCG/ACT inhaler Inhale 1-2 puffs into the lungs every 6 (six) hours as needed for wheezing or shortness of breath. 07/18/18   Claude Manges, PA-C  aspirin-acetaminophen-caffeine (EXCEDRIN MIGRAINE) [phone] MG per tablet Take 2 tablets by mouth every 6 (six) hours as needed for headache.    [provider]  lithium carbonate 300 MG capsule Take 300 mg by mouth 2 (two) times daily with a meal.    [provider]  QUEtiapine (SEROQUEL) 300 MG tablet Take 300 mg by mouth at bedtime.    [provider]    Allergies    Coconut oil  Review of Systems   Review of Systems  Constitutional:  Negative for chills and fever.  Genitourinary: Negative.   Skin:  Positive for rash.   Physical Exam Updated Vital Signs BP 138/72 (BP Location: Left Arm)   Pulse (!) 113   Temp 98 F (36.7 C) (Oral)   Resp 18   Ht 5\' 6"  (1.676 m)   Wt 63 kg   SpO2 97%   BMI 22.42 kg/m   Physical Exam Vitals and nursing note reviewed.  Constitutional:      General: He is not in acute distress.    Appearance: He is well-developed. He is not diaphoretic.  HENT:     Head: Normocephalic and atraumatic.  Eyes:     General: No scleral icterus.    Conjunctiva/sclera: Conjunctivae normal.  Cardiovascular:     Rate and Rhythm: Normal rate and regular rhythm.     Heart sounds: Normal heart sounds.  Pulmonary:     Effort: Pulmonary effort is normal. No respiratory distress.     Breath sounds: Normal breath sounds.  Abdominal:     Palpations: Abdomen is soft.  Tenderness: There is no abdominal tenderness.  Musculoskeletal:     Cervical back: Normal range of motion and neck supple.     Right lower leg: No edema.     Left lower leg: No edema.  Skin:    General: Skin is warm and dry.     Coloration: Skin is not jaundiced.     Findings: Erythema and rash present.     Comments: Singular and confluent morbilliform erythematous rash with plantar and palmar involvement.  No evidence of mucosal involvement.  Neurological:     Mental Status: He is alert.  Psychiatric:        Behavior: Behavior normal.    ED Results / Procedures / Treatments   Labs (all labs ordered are listed, but only abnormal results are displayed) Labs Reviewed  CBC WITH DIFFERENTIAL/PLATELET  COMPREHENSIVE METABOLIC PANEL    EKG None  Radiology No results found.  Procedures Procedures   Medications Ordered in  ED Medications - No data to display  ED Course  I have reviewed the triage vital signs and the nursing notes.  Pertinent labs & imaging results that were available during my care of the patient were reviewed by me and considered in my medical decision making (see chart for details).    MDM Rules/Calculators/A&P                          Patient here with rash. RPR and HIV pending. SUspect reaction to Lamictal.  No evidence of TENS or early SJS.  Patient may use hydrocortisone lotion for treatment of his itching.  He appears otherwise appropriate for discharge at this time.  No evidence of anaphylaxis. Final Clinical Impression(s) / ED Diagnoses Final diagnoses:  Drug-induced skin rash    Rx / DC Orders ED Discharge Orders     None        Arthor Captain, PA-C 01/02/21 2300    Melene Plan, DO 01/11/21 1600

## 2021-01-02 LAB — RPR: RPR Ser Ql: NONREACTIVE

## 2021-01-02 LAB — HIV ANTIBODY (ROUTINE TESTING W REFLEX): HIV Screen 4th Generation wRfx: NONREACTIVE

## 2021-10-27 ENCOUNTER — Other Ambulatory Visit: Payer: Self-pay

## 2021-10-27 ENCOUNTER — Encounter (HOSPITAL_COMMUNITY): Payer: Self-pay | Admitting: Emergency Medicine

## 2021-10-27 ENCOUNTER — Emergency Department (HOSPITAL_COMMUNITY)
Admission: EM | Admit: 2021-10-27 | Discharge: 2021-10-27 | Disposition: A | Discharge status: Home / Self Care | Payer: Self-pay | Attending: Emergency Medicine | Admitting: Emergency Medicine

## 2021-10-27 DIAGNOSIS — K047 Periapical abscess without sinus: Secondary | ICD-10-CM | POA: Insufficient documentation

## 2021-10-27 DIAGNOSIS — Z7982 Long term (current) use of aspirin: Secondary | ICD-10-CM | POA: Insufficient documentation

## 2021-10-27 DIAGNOSIS — K0889 Other specified disorders of teeth and supporting structures: Secondary | ICD-10-CM

## 2021-10-27 MED ORDER — PENICILLIN V POTASSIUM 500 MG PO TABS: 500.0000 mg | ORAL_TABLET | Freq: Four times a day (QID) | ORAL | 0 refills | Status: AC

## 2021-10-27 NOTE — ED Provider Notes (Signed)
[facility]-EMERGENCY DEPT Provider Note   CSN: [id number] Arrival date & time: 10/27/21  1545     History  Chief Complaint  Patient presents with   Dental Pain    Justin Mosley is a 31 y.o. male.  31 year old male with no past medical history presents to the ED with a chief complaint of right-sided facial pain for the past 3 weeks.  Patient reports one of his tooth broke, he noted an abscess forming around that, when brushing his teeth.  Reports after this abscess burst that, he began to have a fever for approximately 2 days.  He has been taking Tylenol for pain control without much improvement in his symptoms.  He does report trying to apply for financial assistance in order to have a dental visit.  He denies any trouble with secretions, no further fevers, no other complaints.  No sore throat.  The history is provided by the patient and medical records.  Dental Pain Associated symptoms: fever       Home Medications Prior to Admission medications   Medication Sig Start Date End Date Taking? Authorizing Provider  penicillin v potassium (VEETID) 500 MG tablet Take 1 tablet (500 mg total) by mouth 4 (four) times daily for 7 days. 10/27/21 11/03/21 Yes Rece Zechman, PA-C  albuterol (PROVENTIL HFA;VENTOLIN HFA) 108 (90 Base) MCG/ACT inhaler Inhale 1-2 puffs into the lungs every 6 (six) hours as needed for wheezing or shortness of breath. 07/18/18   Claude Manges, PA-C  aspirin-acetaminophen-caffeine (EXCEDRIN MIGRAINE) [phone] MG per tablet Take 2 tablets by mouth every 6 (six) hours as needed for headache.    [provider]  lithium carbonate 300 MG capsule Take 300 mg by mouth 2 (two) times daily with a meal.    [provider]  QUEtiapine (SEROQUEL) 300 MG tablet Take 300 mg by mouth at bedtime.    [provider]      Allergies    Coconut (cocos nucifera)    Review of Systems   Review of Systems  Constitutional:  Positive for fever.   HENT:  Positive for dental problem.    Physical Exam Updated Vital Signs BP (!) 153/106 (BP Location: Left Arm)   Pulse 76   Temp 98.1 F (36.7 C) (Oral)   Resp 16   SpO2 100%  Physical Exam Vitals and nursing note reviewed.  HENT:     Head: Normocephalic and atraumatic.     Mouth/Throat:     Mouth: Mucous membranes are dry.     Dentition: Abnormal dentition. Dental tenderness present. No dental abscesses or gum lesions.     Pharynx: Oropharynx is clear. Uvula midline. No oropharyngeal exudate or posterior oropharyngeal erythema.     Tonsils: No tonsillar exudate or tonsillar abscesses.     Comments: Mentation is poor throughout.  There is dental tenderness along the right second molar with missing pieces. No visual abscess.  Eyes:     Pupils: Pupils are equal, round, and reactive to light.  Cardiovascular:     Rate and Rhythm: Normal rate.  Pulmonary:     Effort: Pulmonary effort is normal.  Abdominal:     General: Abdomen is flat.  Musculoskeletal:     Cervical back: Normal range of motion and neck supple.  Skin:    General: Skin is warm and dry.  Neurological:     Mental Status: He is alert and oriented to person, place, and time.    ED Results / Procedures / Treatments  Labs (all labs ordered are listed, but only abnormal results are displayed) Labs Reviewed - No data to display  EKG None  Radiology No results found.  Procedures Procedures    Medications Ordered in ED Medications - No data to display  ED Course/ Medical Decision Making/ A&P                           Medical Decision Making  Patient presents to ED with a chief complaint of dental pain has been ongoing for the past 3 weeks.  Found to have an abscess, this burst that he had 2 days worth of fever however symptoms have now improved.  He continues to endorse a right-sided headache, has been taking Tylenol for improvement in symptoms.  During evaluation patient is overall  nontoxic-appearing, oropharynx is clear, no erythema noted.  Poor dentition throughout, with missing chipped part of the second molar, there is pain with palpation however there is no abscess visualized.  There is some perhaps pus collection along the missing tooth, very hard to evaluate whether this is a abscess at this time.  Did discuss with patient placing him on a short course of antibiotics in order to help prevent any further infection.  He does report getting financial assistance in order to see a dentist in the next few weeks.  We will send patient home with a prescription of penicillin V for his dental abscess.  Patient is hemodynamically stable for discharge.  Portions of this note were generated with Scientist, clinical (histocompatibility and immunogenetics). Dictation errors may occur despite best attempts at proofreading.   Final Clinical Impression(s) / ED Diagnoses Final diagnoses:  Pain, dental  Dental abscess    Rx / DC Orders ED Discharge Orders          Ordered    penicillin v potassium (VEETID) 500 MG tablet  4 times daily        10/27/21 1651              Claude Manges, PA-C 10/27/21 1655    Mancel Bale, MD 10/28/21 0008

## 2021-10-27 NOTE — Discharge Instructions (Addendum)
You were given a prescription for antibiotics to help treat your dental infection.  Please take 1 tablet 4 times a day for the next 7 days.  You will need to follow-up with a dentist on an outpatient basis in order to have an appointment scheduled

## 2021-10-27 NOTE — ED Triage Notes (Signed)
Pt reports dental pain on right side for about 3 weeks.

## 2022-01-06 ENCOUNTER — Ambulatory Visit (HOSPITAL_COMMUNITY)
Admission: EM | Admit: 2022-01-06 | Discharge: 2022-01-07 | Disposition: A | Discharge status: Home / Self Care | Payer: No Payment, Other | Attending: Psychiatry | Admitting: Psychiatry

## 2022-01-06 DIAGNOSIS — F919 Conduct disorder, unspecified: Secondary | ICD-10-CM | POA: Insufficient documentation

## 2022-01-06 DIAGNOSIS — Z20822 Contact with and (suspected) exposure to covid-19: Secondary | ICD-10-CM | POA: Insufficient documentation

## 2022-01-06 DIAGNOSIS — F69 Unspecified disorder of adult personality and behavior: Secondary | ICD-10-CM

## 2022-01-06 DIAGNOSIS — Z9151 Personal history of suicidal behavior: Secondary | ICD-10-CM | POA: Insufficient documentation

## 2022-01-06 LAB — TSH: TSH: 1.633 u[IU]/mL (ref 0.350–4.500)

## 2022-01-06 LAB — CBC WITH DIFFERENTIAL/PLATELET
Abs Immature Granulocytes: 0.01 10*3/uL (ref 0.00–0.07)
Basophils Absolute: 0 10*3/uL (ref 0.0–0.1)
Basophils Relative: 0 %
Eosinophils Absolute: 0 10*3/uL (ref 0.0–0.5)
Eosinophils Relative: 0 %
HCT: 46.8 % (ref 39.0–52.0)
Hemoglobin: 15.8 g/dL (ref 13.0–17.0)
Immature Granulocytes: 0 %
Lymphocytes Relative: 16 %
Lymphs Abs: 1.3 10*3/uL (ref 0.7–4.0)
MCH: 30.1 pg (ref 26.0–34.0)
MCHC: 33.8 g/dL (ref 30.0–36.0)
MCV: 89.1 fL (ref 80.0–100.0)
Monocytes Absolute: 0.3 10*3/uL (ref 0.1–1.0)
Monocytes Relative: 4 %
Neutro Abs: 6.3 10*3/uL (ref 1.7–7.7)
Neutrophils Relative %: 80 %
Platelets: 180 10*3/uL (ref 150–400)
RBC: 5.25 MIL/uL (ref 4.22–5.81)
RDW: 12.8 % (ref 11.5–15.5)
WBC: 8 10*3/uL (ref 4.0–10.5)
nRBC: 0 % (ref 0.0–0.2)

## 2022-01-06 LAB — COMPREHENSIVE METABOLIC PANEL
ALT: 10 U/L (ref 0–44)
AST: 19 U/L (ref 15–41)
Albumin: 4.6 g/dL (ref 3.5–5.0)
Alkaline Phosphatase: 71 U/L (ref 38–126)
Anion gap: 9 (ref 5–15)
BUN: 5 mg/dL — ABNORMAL LOW (ref 6–20)
CO2: 28 mmol/L (ref 22–32)
Calcium: 10 mg/dL (ref 8.9–10.3)
Chloride: 104 mmol/L (ref 98–111)
Creatinine, Ser: 0.92 mg/dL (ref 0.61–1.24)
GFR, Estimated: >60 mL/min (ref >60)
Glucose, Bld: 77 mg/dL (ref 70–99)
Potassium: 4.5 mmol/L (ref 3.5–5.1)
Sodium: 141 mmol/L (ref 135–145)
Total Bilirubin: 0.2 mg/dL — ABNORMAL LOW (ref 0.3–1.2)
Total Protein: 7.6 g/dL (ref 6.5–8.1)

## 2022-01-06 LAB — POCT URINE DRUG SCREEN - MANUAL ENTRY (I-SCREEN)
POC Amphetamine UR: NOT DETECTED
POC Buprenorphine (BUP): NOT DETECTED
POC Cocaine UR: NOT DETECTED
POC Marijuana UR: POSITIVE — AB
POC Methadone UR: NOT DETECTED
POC Methamphetamine UR: NOT DETECTED
POC Morphine: NOT DETECTED
POC Oxazepam (BZO): NOT DETECTED
POC Oxycodone UR: NOT DETECTED
POC Secobarbital (BAR): NOT DETECTED

## 2022-01-06 LAB — RESP PANEL BY RT-PCR (FLU A&B, COVID) ARPGX2
Influenza A by PCR: NEGATIVE
Influenza B by PCR: NEGATIVE
SARS Coronavirus 2 by RT PCR: NEGATIVE

## 2022-01-06 LAB — LIPID PANEL
Cholesterol: 135 mg/dL (ref 0–200)
HDL: 64 mg/dL (ref >40)
LDL Cholesterol: 62 mg/dL (ref 0–99)
Total CHOL/HDL Ratio: 2.1 RATIO
Triglycerides: 43 mg/dL (ref <150)
VLDL: 9 mg/dL (ref 0–40)

## 2022-01-06 LAB — POC SARS CORONAVIRUS 2 AG: SARSCOV2ONAVIRUS 2 AG: NEGATIVE

## 2022-01-06 LAB — HEMOGLOBIN A1C
Hgb A1c MFr Bld: 5.1 % (ref 4.8–5.6)
Mean Plasma Glucose: 99.67 mg/dL

## 2022-01-06 LAB — ETHANOL: Alcohol, Ethyl (B): <10 mg/dL (ref <10)

## 2022-01-06 MED ORDER — MAGNESIUM HYDROXIDE 400 MG/5ML PO SUSP: 30.0000 mL | Freq: Every day | ORAL | Status: DC | PRN

## 2022-01-06 MED ORDER — ACETAMINOPHEN 325 MG PO TABS: 650.0000 mg | ORAL_TABLET | Freq: Four times a day (QID) | ORAL | Status: DC | PRN

## 2022-01-06 MED ORDER — HYDROXYZINE HCL 25 MG PO TABS: 25.0000 mg | ORAL_TABLET | Freq: Three times a day (TID) | ORAL | Status: DC | PRN

## 2022-01-06 MED ORDER — ALUM & MAG HYDROXIDE-SIMETH 200-200-20 MG/5ML PO SUSP: 30.0000 mL | ORAL | Status: DC | PRN

## 2022-01-06 MED ORDER — FLUOXETINE HCL 10 MG PO CAPS
10.0000 mg | ORAL_CAPSULE | Freq: Every day | ORAL | Status: DC
  Administered 2022-01-06 – 2022-01-07 (×2): 10 mg via ORAL
  Filled 2022-01-06 (×2): qty 1

## 2022-01-06 MED ORDER — TRAZODONE HCL 50 MG PO TABS: 50.0000 mg | ORAL_TABLET | Freq: Every evening | ORAL | Status: DC | PRN

## 2022-01-06 MED ORDER — PRAZOSIN HCL 1 MG PO CAPS
1.0000 mg | ORAL_CAPSULE | Freq: Every day | ORAL | Status: DC
  Administered 2022-01-06: 1 mg via ORAL
  Filled 2022-01-06: qty 1

## 2022-01-06 MED ORDER — QUETIAPINE FUMARATE 100 MG PO TABS
100.0000 mg | ORAL_TABLET | Freq: Every day | ORAL | Status: DC
  Administered 2022-01-06: 100 mg via ORAL
  Filled 2022-01-06: qty 1

## 2022-01-06 NOTE — Discharge Instructions (Addendum)
Medication Management and Therapy for No Insurance/IPRS Based on observation and your report, outpatient services with psychiatry and therapy have been recommended.  It is imperative to your mental health that seek out services 7-10 day from your day of discharge to prevent another crisis. A list of referrals has been provided below based on your needs and recommendations.  You are not limited to the list provided.  In case of an urgent crisis, you may contact the Mobile Crisis Unit with [facility] at [phone].   You have the option to call 211 for assistance in finding additional mental health agencies if these do not meet your needs.   For your behavioral health needs you are advised to follow up with [facility] at your earliest opportunity:  [facility] [address] [place], [place], [address] [phone] phone   New Patient Assessment/Therapy Walk-Ins:  Monday and Wednesday: 8 am until slots are full. Every 1st and 2nd Fridays of the month: 1 pm - 5 pm.  NO ASSESSMENT/THERAPY WALK-INS ON TUESDAYS OR THURSDAYS  New Patient Assessment/Medication Management Walk-Ins:  Monday - Friday:  8 am - 11 am.  For all walk-ins, we ask that you arrive by 7:00 am because patients will be seen in the order of arrival.  Availability is limited; therefore, you may not be seen on the same day that you walk-in.  Our goal is to serve and meet the needs of our community to the best of our ability.           [facility] [place]      [address]      [place], [place] [address]      [phone]        [facility]      [address] [place], [place], [address]      [phone] phone      [phone] fax        [facility]      [address]      [place], [place], [address]      [phone] phone to set up initial appointment       [facility]     [address]     [place], [place], [address]     [phone] phone     [phone] fax
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
## 2022-01-06 NOTE — BH Assessment (Signed)
LCSW Progress Note  Per Jacqueline E. Lee, NP, this pt does not require psychiatric hospitalization at this time.  Pt is psychiatrically cleared.  Discharge instructions include several resources for outpatient therapy and medication management.  EDP Jacqueline E. Lee, NP, has been notified.  Justin Mosley, MSW, LCSW [facility] [phone] or [phone]  

## 2022-01-06 NOTE — ED Notes (Signed)
Pt A&O x 4, no distress noted, sleeping at present.  Monitoring for safety.  Pt is IVC.

## 2022-01-06 NOTE — ED Notes (Signed)
Pt sleeping at present, no distress noted. Respirations even & unlabored.  Monitoring for safety. 
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
## 2022-01-06 NOTE — ED Notes (Signed)
Patient resting quietly in bed with eyes closed. Respirations equal and unlabored, skin warm and dry, NAD. No change in assessment or acuity. Routine safety checks conducted according to facility protocol. Will continue to monitor for safety.   

## 2022-01-07 NOTE — ED Notes (Signed)
Patient A&O x 4, ambulatory. Patient discharged in no acute distress. Patient denied SI/HI, A/VH upon discharge. Patient verbalized understanding of all discharge instructions explained by staff, to include follow up appointments, RX's and safety plan. Patient reported mood 10/10.  Pt belongings returned to patient from locker #17 intact. Patient escorted to lobby via staff for transport to destination. Safety maintained.  

## 2022-01-07 NOTE — ED Notes (Signed)
Provider interacting with pt. No acute distress noted. Safety maintained. 

## 2022-01-07 NOTE — ED Notes (Signed)
Pt sitting up eating lunch. Denies concerns or needs at present. Cooperative and pleasant. Will continue to monitor for safety.

## 2022-01-07 NOTE — ED Notes (Signed)
Pt sleeping at present, no distress noted. Respirations even & unlabored.  Monitoring for safety. 
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
## 2022-01-10 ENCOUNTER — Ambulatory Visit (INDEPENDENT_AMBULATORY_CARE_PROVIDER_SITE_OTHER): Payer: No Payment, Other | Admitting: Psychiatry

## 2022-01-10 ENCOUNTER — Encounter (HOSPITAL_COMMUNITY): Payer: Self-pay | Admitting: Psychiatry

## 2022-01-10 DIAGNOSIS — F250 Schizoaffective disorder, bipolar type: Secondary | ICD-10-CM | POA: Diagnosis not present

## 2022-01-10 DIAGNOSIS — F411 Generalized anxiety disorder: Secondary | ICD-10-CM

## 2022-01-10 DIAGNOSIS — F4310 Post-traumatic stress disorder, unspecified: Secondary | ICD-10-CM | POA: Diagnosis not present

## 2022-01-10 MED ORDER — QUETIAPINE FUMARATE 100 MG PO TABS: 100.0000 mg | ORAL_TABLET | Freq: Every day | ORAL | 3 refills | Status: DC

## 2022-01-10 MED ORDER — PRAZOSIN HCL 1 MG PO CAPS: 1.0000 mg | ORAL_CAPSULE | Freq: Every day | ORAL | 3 refills | Status: DC

## 2022-01-10 MED ORDER — FLUOXETINE HCL 10 MG PO CAPS: 10.0000 mg | ORAL_CAPSULE | Freq: Every morning | ORAL | 3 refills | Status: DC
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
## 2022-05-22 ENCOUNTER — Other Ambulatory Visit (HOSPITAL_COMMUNITY): Payer: Self-pay | Admitting: Psychiatry

## 2022-05-22 DIAGNOSIS — F4310 Post-traumatic stress disorder, unspecified: Secondary | ICD-10-CM

## 2022-05-22 DIAGNOSIS — F411 Generalized anxiety disorder: Secondary | ICD-10-CM

## 2022-10-12 ENCOUNTER — Ambulatory Visit
Admission: EM | Admit: 2022-10-12 | Discharge: 2022-10-12 | Disposition: A | Discharge status: Home / Self Care | Payer: Medicare Other | Attending: Family Medicine | Admitting: Family Medicine

## 2022-10-12 DIAGNOSIS — R369 Urethral discharge, unspecified: Secondary | ICD-10-CM

## 2022-10-12 LAB — POCT URINALYSIS DIP (MANUAL ENTRY)
Bilirubin, UA: NEGATIVE
Glucose, UA: NEGATIVE mg/dL
Ketones, POC UA: NEGATIVE mg/dL
Leukocytes, UA: NEGATIVE
Nitrite, UA: NEGATIVE
Protein Ur, POC: NEGATIVE mg/dL
Spec Grav, UA: 1.015 (ref 1.010–1.025)
Urobilinogen, UA: 2 E.U./dL — AB
pH, UA: 6 (ref 5.0–8.0)

## 2022-10-12 NOTE — ED Provider Notes (Signed)
[facility]   [id number] 10/12/22 Arrival Time: 1743  ASSESSMENT & PLAN:  1. Penile discharge    No empiric tx.    Discharge Instructions      In addition to a urine culture we have sent testing for sexually transmitted infections. We will notify you of any positive results once they are received. If required, we will prescribe any medications you might need.  Please refrain from all sexual activity for at least the next seven days.     Pending: Labs Reviewed  POCT URINALYSIS DIP (MANUAL ENTRY) - Abnormal; Notable for the following components:      Result Value   Blood, UA trace-intact (*)    Urobilinogen, UA 2.0 (*)    All other components within normal limits  URINE CULTURE  CYTOLOGY, (ORAL, ANAL, URETHRAL) ANCILLARY ONLY    Will notify of any positive results. Instructed to refrain from sexual activity for at least seven days.  Reviewed expectations re: course of current medical issues. Questions answered. Outlined signs and symptoms indicating need for more acute intervention. Patient verbalized understanding. After Visit Summary given.   SUBJECTIVE:  Justin Mosley is a 32 y.o. male who is here for penile irritation x 1-2 weeks. Male partner with dx Trich. He reports he has tested negative a few times. Has never done penile swab. Desires STI testing. Normal urinary habits. Denies fever. Does feel like he has some penile discharge.  OBJECTIVE:  Vitals:   10/12/22 1858  BP: (!) 145/99  Pulse: 87  Resp: 18  Temp: 98.3 F (36.8 C)  TempSrc: Oral  SpO2: 98%    General appearance: alert, cooperative, appears stated age and no distress Throat: lips, mucosa, and tongue normal; teeth and gums normal Lungs: unlabored respirations; speaks full sentences without difficulty Back: no CVA tenderness; FROM at waist Abdomen: soft, non-tender GU: deferred Skin: warm and dry Psychological: alert and cooperative; normal mood and affect.  Results for  orders placed or performed during the hospital encounter of 10/12/22  POCT urinalysis dipstick  Result Value Ref Range   Color, UA yellow yellow   Clarity, UA clear clear   Glucose, UA negative negative mg/dL   Bilirubin, UA negative negative   Ketones, POC UA negative negative mg/dL   Spec Grav, UA 4.098 1.191 - 1.025   Blood, UA trace-intact (A) negative   pH, UA 6.0 5.0 - 8.0   Protein Ur, POC negative negative mg/dL   Urobilinogen, UA 2.0 (A) 0.2 or 1.0 E.U./dL   Nitrite, UA Negative Negative   Leukocytes, UA Negative Negative    Labs Reviewed  POCT URINALYSIS DIP (MANUAL ENTRY) - Abnormal; Notable for the following components:      Result Value   Blood, UA trace-intact (*)    Urobilinogen, UA 2.0 (*)    All other components within normal limits  URINE CULTURE  CYTOLOGY, (ORAL, ANAL, URETHRAL) ANCILLARY ONLY    Allergies  Allergen Reactions   Coconut (Cocos Nucifera) Other (See Comments)    Breaks out   Lamictal [Lamotrigine] Rash    Past Medical History:  Diagnosis Date   Migraine    Family History  Problem Relation Age of Onset   Hypertension Mother    Hypertension Father    Diabetes Father    Social History   Socioeconomic History   Marital status: Single    Spouse name: Not on file   Number of children: Not on file   Years of education: Not on file  Highest education level: Not on file  Occupational History   Not on file  Tobacco Use   Smoking status: Never   Smokeless tobacco: Never  Vaping Use   Vaping Use: Former  Substance and Sexual Activity   Alcohol use: No   Drug use: No   Sexual activity: Not on file  Other Topics Concern   Not on file  Social History Narrative   Not on file   Social Determinants of Health   Financial Resource Strain: Not on file  Food Insecurity: Not on file  Transportation Needs: Not on file  Physical Activity: Not on file  Stress: Not on file  Social Connections: Not on file  Intimate Partner Violence:  Not on file           Mardella Layman, MD 10/12/22 1921

## 2022-10-12 NOTE — Discharge Instructions (Signed)
In addition to a urine culture we have sent testing for sexually transmitted infections. We will notify you of any positive results once they are received. If required, we will prescribe any medications you might need.  Please refrain from all sexual activity for at least the next seven days.

## 2022-10-12 NOTE — ED Triage Notes (Signed)
Here for penile itching x 1-2 weeks. Abdominal pain started last night. Pt requesting STI-testing.

## 2022-10-13 LAB — CYTOLOGY, (ORAL, ANAL, URETHRAL) ANCILLARY ONLY
Chlamydia: NEGATIVE
Comment: NEGATIVE
Comment: NEGATIVE
Comment: NORMAL
Neisseria Gonorrhea: NEGATIVE
Trichomonas: POSITIVE — AB

## 2022-10-14 ENCOUNTER — Telehealth (HOSPITAL_COMMUNITY): Payer: Self-pay | Admitting: Emergency Medicine

## 2022-10-14 ENCOUNTER — Other Ambulatory Visit (HOSPITAL_COMMUNITY): Payer: Self-pay | Admitting: Psychiatry

## 2022-10-14 DIAGNOSIS — F411 Generalized anxiety disorder: Secondary | ICD-10-CM

## 2022-10-14 DIAGNOSIS — F4310 Post-traumatic stress disorder, unspecified: Secondary | ICD-10-CM

## 2022-10-14 LAB — URINE CULTURE: Culture: NO GROWTH

## 2022-10-14 MED ORDER — METRONIDAZOLE 500 MG PO TABS: 2000.0000 mg | ORAL_TABLET | Freq: Once | ORAL | 0 refills | Status: AC

## 2022-11-29 ENCOUNTER — Ambulatory Visit (INDEPENDENT_AMBULATORY_CARE_PROVIDER_SITE_OTHER): Payer: Medicare Other | Admitting: Clinical

## 2022-11-29 ENCOUNTER — Encounter (HOSPITAL_COMMUNITY): Payer: Self-pay

## 2022-11-29 DIAGNOSIS — F250 Schizoaffective disorder, bipolar type: Secondary | ICD-10-CM | POA: Diagnosis not present
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
## 2022-12-12 ENCOUNTER — Ambulatory Visit: Payer: Medicare Other

## 2022-12-19 ENCOUNTER — Ambulatory Visit
Admission: RE | Admit: 2022-12-19 | Discharge: 2022-12-19 | Disposition: A | Discharge status: Home / Self Care | Payer: Medicare Other | Source: Ambulatory Visit | Attending: Internal Medicine | Admitting: Internal Medicine

## 2022-12-19 VITALS — BP 138/86 | HR 58 | Temp 97.6°F | Resp 16

## 2022-12-19 DIAGNOSIS — R369 Urethral discharge, unspecified: Secondary | ICD-10-CM | POA: Insufficient documentation

## 2022-12-19 DIAGNOSIS — Z113 Encounter for screening for infections with a predominantly sexual mode of transmission: Secondary | ICD-10-CM | POA: Diagnosis not present

## 2022-12-19 HISTORY — DX: Post-traumatic stress disorder, unspecified: F43.10

## 2022-12-19 HISTORY — DX: Schizoaffective disorder, unspecified: F25.9

## 2022-12-19 NOTE — Discharge Instructions (Signed)
Your penile swab is pending.  Will call when it results. Refrain from sexual activity until test results and treatment are complete.

## 2022-12-19 NOTE — ED Provider Notes (Signed)
[facility]    CSN: [id number] Arrival date & time: 12/19/22  1212      History   Chief Complaint Chief Complaint  Patient presents with   Penile Discharge    HPI Cliford Sequeira is a 32 y.o. male.   Patient presents today with clear penile discharge and penile itching that started a few days ago.  Reports that he was tested and treated for trichomonas back in May.  States that the symptoms feel very similar.  Although, he has not had any sexual exposures or encounters since being tested and treated in May.  Denies any other associated symptoms.   Penile Discharge    Past Medical History:  Diagnosis Date   Migraine    PTSD (post-traumatic stress disorder)    Schizoaffective disorder (HCC)     There are no problems to display for this patient.   Past Surgical History:  Procedure Laterality Date   HAND SURGERY         Home Medications    Prior to Admission medications   Medication Sig Start Date End Date Taking? Authorizing Provider  FLUoxetine (PROZAC) 10 MG capsule Take 1 capsule (10 mg total) by mouth in the morning. 01/10/22   Shanna Cisco, NP  prazosin (MINIPRESS) 1 MG capsule Take 1 capsule (1 mg total) by mouth at bedtime. 01/10/22   Shanna Cisco, NP  QUEtiapine (SEROQUEL) 100 MG tablet Take 1 tablet (100 mg total) by mouth daily. 01/10/22   Shanna Cisco, NP    Family History Family History  Problem Relation Age of Onset   Hypertension Mother    Hypertension Father    Diabetes Father     Social History Social History   Tobacco Use   Smoking status: Former    Types: Cigarettes   Smokeless tobacco: Never  Vaping Use   Vaping status: Former  Substance Use Topics   Alcohol use: Yes    Comment: rarely   Drug use: Not Currently    Types: Marijuana     Allergies   Coconut (cocos nucifera) and Lamictal [lamotrigine]   Review of Systems Review of Systems Per HPI  Physical Exam Triage Vital Signs ED Triage  Vitals  Encounter Vitals Group     BP 12/19/22 1220 138/86     Systolic BP Percentile --      Diastolic BP Percentile --      Pulse Rate 12/19/22 1220 (!) 58     Resp 12/19/22 1220 16     Temp 12/19/22 1220 97.6 F (36.4 C)     Temp Source 12/19/22 1220 Oral     SpO2 12/19/22 1220 99 %     Weight --      Height --      Head Circumference --      Peak Flow --      Pain Score 12/19/22 1221 0     Pain Loc --      Pain Education --      Exclude from Growth Chart --    No data found.  Updated Vital Signs BP 138/86   Pulse (!) 58   Temp 97.6 F (36.4 C) (Oral)   Resp 16   SpO2 99%   Visual Acuity Right Eye Distance:   Left Eye Distance:   Bilateral Distance:    Right Eye Near:   Left Eye Near:    Bilateral Near:     Physical Exam Constitutional:      General:  He is not in acute distress.    Appearance: Normal appearance. He is not toxic-appearing or diaphoretic.  HENT:     Head: Normocephalic and atraumatic.  Eyes:     Extraocular Movements: Extraocular movements intact.     Conjunctiva/sclera: Conjunctivae normal.  Pulmonary:     Effort: Pulmonary effort is normal.  Genitourinary:    Comments: Deferred with shared decision making.  Self swab performed. Neurological:     General: No focal deficit present.     Mental Status: He is alert and oriented to person, place, and time. Mental status is at baseline.  Psychiatric:        Mood and Affect: Mood normal.        Behavior: Behavior normal.        Thought Content: Thought content normal.        Judgment: Judgment normal.      UC Treatments / Results  Labs (all labs ordered are listed, but only abnormal results are displayed) Labs Reviewed  CYTOLOGY, (ORAL, ANAL, URETHRAL) ANCILLARY ONLY    EKG   Radiology No results found.  Procedures Procedures (including critical care time)  Medications Ordered in UC Medications - No data to display  Initial Impression / Assessment and Plan / UC Course  I  have reviewed the triage vital signs and the nursing notes.  Pertinent labs & imaging results that were available during my care of the patient were reviewed by me and considered in my medical decision making (see chart for details).     Cytology swab pending.  Will await results to see if trichomonas is still cause of symptoms prior to any treatment.  Advised patient to refrain from sexual activity until test results and treatment are complete.  Advised strict follow-up precautions.  Patient verbalized understanding and was agreeable with plan. Final Clinical Impressions(s) / UC Diagnoses   Final diagnoses:  Penile discharge  Screening examination for venereal disease     Discharge Instructions      Your penile swab is pending.  Will call when it results. Refrain from sexual activity until test results and treatment are complete.    ED Prescriptions   None    PDMP not reviewed this encounter.   Gustavus Bryant, [place] 12/19/22 1235

## 2022-12-19 NOTE — ED Triage Notes (Signed)
C/O penile discharge and irritation. States was treated for trich with single dose of metronidazole back in May; states had some improvement, but now sxs are completely returned.

## 2022-12-21 ENCOUNTER — Encounter (HOSPITAL_COMMUNITY): Payer: Self-pay | Admitting: Student in an Organized Health Care Education/Training Program

## 2022-12-21 ENCOUNTER — Ambulatory Visit (HOSPITAL_COMMUNITY): Payer: Medicare Other | Admitting: Student in an Organized Health Care Education/Training Program

## 2022-12-21 VITALS — BP 153/84 | HR 103 | Temp 98.5°F | Ht 66.0 in | Wt 135.0 lb

## 2022-12-21 DIAGNOSIS — F410 Panic disorder [episodic paroxysmal anxiety] without agoraphobia: Secondary | ICD-10-CM

## 2022-12-21 DIAGNOSIS — F411 Generalized anxiety disorder: Secondary | ICD-10-CM | POA: Diagnosis not present

## 2022-12-21 DIAGNOSIS — F4310 Post-traumatic stress disorder, unspecified: Secondary | ICD-10-CM | POA: Diagnosis not present

## 2022-12-21 DIAGNOSIS — F250 Schizoaffective disorder, bipolar type: Secondary | ICD-10-CM

## 2022-12-21 DIAGNOSIS — F259 Schizoaffective disorder, unspecified: Secondary | ICD-10-CM | POA: Insufficient documentation

## 2022-12-21 LAB — CYTOLOGY, (ORAL, ANAL, URETHRAL) ANCILLARY ONLY
Chlamydia: NEGATIVE
Comment: NEGATIVE
Comment: NEGATIVE
Comment: NORMAL
Neisseria Gonorrhea: NEGATIVE
Trichomonas: NEGATIVE

## 2022-12-21 MED ORDER — FLUOXETINE HCL 10 MG PO CAPS: 10.0000 mg | ORAL_CAPSULE | Freq: Every morning | ORAL | 3 refills | Status: DC

## 2022-12-21 MED ORDER — QUETIAPINE FUMARATE 100 MG PO TABS
100.0000 mg | ORAL_TABLET | Freq: Every day | ORAL | 3 refills | Status: DC
Start: 2022-12-21 — End: 2022-12-22

## 2022-12-21 MED ORDER — PRAZOSIN HCL 1 MG PO CAPS
1.0000 mg | ORAL_CAPSULE | Freq: Every day | ORAL | 3 refills | Status: DC
Start: 2022-12-21 — End: 2022-12-22

## 2022-12-21 NOTE — Patient Instructions (Addendum)
[facility] Patient Name: Allah Reason Date of Visit: [12/21/22  Provider Name: Dr. Theodis Aguas   Dear [name], it was a pleasure seeing you today, and we appreciate the opportunity to care for you.   Below is a brief summary of things we discussed:  We discussed the importance of taking our medications as scheduled Reaching out to the others when you need help Continue eating and keep up your weight Building self esteem and self worth    These are our shared goals we should aim for by our next visit: Making sure the medications have been taken as scheduled, and that our depression and anxiety has become manageable Aim for 8-9 hours a night.  Make sure you have seen your PCP by next visit to make sure your blood pressure is properly managed.    These are the changes we have made to your medications:  Seroquel 100 mg tablet nightly Prazosin 1 mg tablet nightly Take your Prozac 10 mg every morning with breakfast   I recommend the following laboratory tests for you to have done. Please request these from your primary care provider (PCP): CBC, CMP, TSH, A1C, Vit D   Lifestyle Recommendations Sleep Hygiene: Aim for 7-9 hours of quality sleep each night. Establish a regular sleep routine and create a restful environment. Stress Management: Practice stress-reducing techniques such as mindfulness, meditation, deep breathing exercises, or hobbies that you enjoy. Smoking Cessation: If you smoke, consider quitting. We can provide resources and support to help you. Alcohol Consumption: Limit alcohol intake to moderate levels--up to one drink per day for women and up to two drinks per day for men. Routine Health Screenings: Stay up-to-date with routine health screenings and vaccinations as recommended.
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
## 2022-12-22 MED ORDER — PRAZOSIN HCL 1 MG PO CAPS
1.0000 mg | ORAL_CAPSULE | Freq: Every day | ORAL | 3 refills | Status: DC
Start: 2022-12-22 — End: 2023-02-22

## 2022-12-22 MED ORDER — QUETIAPINE FUMARATE 100 MG PO TABS
100.0000 mg | ORAL_TABLET | Freq: Every day | ORAL | 3 refills | Status: DC
Start: 2022-12-22 — End: 2023-02-22

## 2022-12-22 MED ORDER — FLUOXETINE HCL 10 MG PO CAPS
10.0000 mg | ORAL_CAPSULE | Freq: Every morning | ORAL | 3 refills | Status: DC
Start: 2022-12-22 — End: 2023-02-22

## 2022-12-23 NOTE — Addendum Note (Signed)
Addended by: Tia Masker on: 12/23/2022 02:56 PM   Modules accepted: Level of Service

## 2022-12-27 ENCOUNTER — Encounter: Payer: Self-pay | Admitting: Family

## 2022-12-27 ENCOUNTER — Ambulatory Visit (INDEPENDENT_AMBULATORY_CARE_PROVIDER_SITE_OTHER): Payer: Medicare Other | Admitting: Family

## 2022-12-27 VITALS — BP 144/100 | HR 101 | Temp 97.8°F | Ht 67.0 in | Wt 128.4 lb

## 2022-12-27 DIAGNOSIS — Z1329 Encounter for screening for other suspected endocrine disorder: Secondary | ICD-10-CM

## 2022-12-27 DIAGNOSIS — Z131 Encounter for screening for diabetes mellitus: Secondary | ICD-10-CM

## 2022-12-27 DIAGNOSIS — R079 Chest pain, unspecified: Secondary | ICD-10-CM | POA: Diagnosis not present

## 2022-12-27 DIAGNOSIS — R030 Elevated blood-pressure reading, without diagnosis of hypertension: Secondary | ICD-10-CM

## 2022-12-27 DIAGNOSIS — Z13220 Encounter for screening for lipoid disorders: Secondary | ICD-10-CM

## 2022-12-27 DIAGNOSIS — Z7689 Persons encountering health services in other specified circumstances: Secondary | ICD-10-CM | POA: Diagnosis not present

## 2022-12-27 DIAGNOSIS — Z130 Encounter for screening for diseases of the blood and blood-forming organs and certain disorders involving the immune mechanism: Secondary | ICD-10-CM

## 2022-12-27 DIAGNOSIS — Z13228 Encounter for screening for other metabolic disorders: Secondary | ICD-10-CM

## 2022-12-27 DIAGNOSIS — Z1159 Encounter for screening for other viral diseases: Secondary | ICD-10-CM

## 2022-12-27 DIAGNOSIS — Z0000 Encounter for general adult medical examination without abnormal findings: Secondary | ICD-10-CM

## 2022-12-27 DIAGNOSIS — Z0120 Encounter for dental examination and cleaning without abnormal findings: Secondary | ICD-10-CM

## 2022-12-27 NOTE — Progress Notes (Signed)
Subjective:    Justin Mosley - 32 y.o. male MRN [id number]  Date of birth: 11/26/1990  HPI  Justin Mosley is to establish care and annual physical exam.   Current issues and/or concerns: - States he is aware that his blood pressure is elevated. Endorses intermittent chest pain for "years". He denies additional reed flag symptoms. States he doesn't want to take "any more medications".  - Reports trying to find a dentist that accepts Medicare/Medicaid. - Reports since recent Urgent Care visit symptoms resolved. - Established with Psychiatry.  - No further issues/concerns for discussion today.   ROS per HPI    Health Maintenance:  Health Maintenance Due  Topic Date Due   Medicare Annual Wellness (AWV)  Never done   Hepatitis C Screening  Never done   DTaP/Tdap/Td (1 - Tdap) Never done   COVID-19 Vaccine (1 - 2023-24 season) Never done     Past Medical History: Patient Active Problem List   Diagnosis Date Noted   Generalized anxiety disorder with panic attacks 12/21/2022   Schizoaffective disorder (HCC) 12/21/2022   PTSD (post-traumatic stress disorder) 12/21/2022      Social History   reports that he has quit smoking. His smoking use included cigarettes. He has never used smokeless tobacco. He reports current alcohol use. He reports that he does not currently use drugs after having used the following drugs: Marijuana.   Family History  family history includes Diabetes in his father; Hypertension in his father and mother.   Medications: reviewed and updated   Objective:   Physical Exam BP (!) 144/100   Pulse (!) 101   Temp 97.8 F (36.6 C) (Oral)   Ht 5\' 7"  (1.702 m)   Wt 128 lb 6.4 oz (58.2 kg)   SpO2 96%   BMI 20.11 kg/m   Physical Exam HENT:     Head: Normocephalic and atraumatic.     Right Ear: Tympanic membrane, ear canal and external ear normal.     Left Ear: Tympanic membrane, ear canal and external ear normal.     Nose: Nose normal.      Mouth/Throat:     Mouth: Mucous membranes are moist.     Pharynx: Oropharynx is clear.  Eyes:     Extraocular Movements: Extraocular movements intact.     Conjunctiva/sclera: Conjunctivae normal.     Pupils: Pupils are equal, round, and reactive to light.  Cardiovascular:     Rate and Rhythm: Normal rate and regular rhythm.     Pulses: Normal pulses.     Heart sounds: Normal heart sounds.  Pulmonary:     Effort: Pulmonary effort is normal.     Breath sounds: Normal breath sounds.  Abdominal:     General: Bowel sounds are normal.     Palpations: Abdomen is soft.  Genitourinary:    Comments: Patient declined.  Musculoskeletal:        General: Normal range of motion.     Right shoulder: Normal.     Left shoulder: Normal.     Right upper arm: Normal.     Left upper arm: Normal.     Right elbow: Normal.     Left elbow: Normal.     Right forearm: Normal.     Left forearm: Normal.     Right wrist: Normal.     Left wrist: Normal.     Right hand: Normal.     Left hand: Normal.     Cervical back: Normal, normal range of motion  and neck supple.     Thoracic back: Normal.     Lumbar back: Normal.     Right hip: Normal.     Left hip: Normal.     Right upper leg: Normal.     Left upper leg: Normal.     Right knee: Normal.     Left knee: Normal.     Right lower leg: Normal.     Left lower leg: Normal.     Right ankle: Normal.     Left ankle: Normal.     Right foot: Normal.     Left foot: Normal.  Skin:    General: Skin is warm and dry.     Capillary Refill: Capillary refill takes less than 2 seconds.  Neurological:     General: No focal deficit present.     Mental Status: He is alert and oriented to person, place, and time.  Psychiatric:        Mood and Affect: Mood normal.        Behavior: Behavior normal.       Assessment & Plan:  1. Encounter to establish care 2. Annual physical exam - Counseled on 150 minutes of exercise per week as tolerated, healthy eating  (including decreased daily intake of saturated fats, cholesterol, added sugars, sodium), STI prevention, and routine healthcare maintenance.  3. Screening for metabolic disorder - Routine screening.  - CMP14+EGFR  4. Screening for deficiency anemia - Routine screening.  - CBC  5. Diabetes mellitus screening - Routine screening.  - Hemoglobin A1c  6. Screening cholesterol level - Routine screening.  - Lipid panel  7. Thyroid disorder screen - Routine screening.  - TSH  8. Need for hepatitis C screening test - Routine screening.  - Hepatitis C Antibody  9. Elevated blood pressure reading 10. Chest pain, unspecified type - Blood pressure not at goal during today's visit. Patient asymptomatic without chest pressure, chest pain, palpitations, shortness of breath, worst headache of life, and any additional red flag symptoms. - Patient declined pharmacological therapy.  - Referral to Cardiology for further evaluation/management.  - Ambulatory referral to Cardiology  11. Encounter for routine dental examination - Patient provided with Dental Resources list.    Patient was given clear instructions to go to Emergency Department or return to medical center if symptoms don't improve, worsen, or new problems develop.The patient verbalized understanding.  I discussed the assessment and treatment plan with the patient. The patient was provided an opportunity to ask questions and all were answered. The patient agreed with the plan and demonstrated an understanding of the instructions.   The patient was advised to call back or seek an in-person evaluation if the symptoms worsen or if the condition fails to improve as anticipated.    Ricky Stabs, NP 12/27/2022, 3:40 PM Primary Care at [facility]

## 2022-12-27 NOTE — Patient Instructions (Signed)
Preventive Care 21-31 Years Old, Male Preventive care refers to lifestyle choices and visits with your health care provider that can promote health and wellness. Preventive care visits are also called wellness exams. What can I expect for my preventive care visit? Counseling During your preventive care visit, your health care provider may ask about your: Medical history, including: Past medical problems. Family medical history. Current health, including: Emotional well-being. Home life and relationship well-being. Sexual activity. Lifestyle, including: Alcohol, nicotine or tobacco, and drug use. Access to firearms. Diet, exercise, and sleep habits. Safety issues such as seatbelt and bike helmet use. Sunscreen use. Work and work environment. Physical exam Your health care provider may check your: Height and weight. These may be used to calculate your BMI (body mass index). BMI is a measurement that tells if you are at a healthy weight. Waist circumference. This measures the distance around your waistline. This measurement also tells if you are at a healthy weight and may help predict your risk of certain diseases, such as type 2 diabetes and high blood pressure. Heart rate and blood pressure. Body temperature. Skin for abnormal spots. What immunizations do I need?  Vaccines are usually given at various ages, according to a schedule. Your health care provider will recommend vaccines for you based on your age, medical history, and lifestyle or other factors, such as travel or where you work. What tests do I need? Screening Your health care provider may recommend screening tests for certain conditions. This may include: Lipid and cholesterol levels. Diabetes screening. This is done by checking your blood sugar (glucose) after you have not eaten for a while (fasting). Hepatitis B test. Hepatitis C test. HIV (human immunodeficiency virus) test. STI (sexually transmitted infection)  testing, if you are at risk. Talk with your health care provider about your test results, treatment options, and if necessary, the need for more tests. Follow these instructions at home: Eating and drinking  Eat a healthy diet that includes fresh fruits and vegetables, whole grains, lean protein, and low-fat dairy products. Drink enough fluid to keep your urine pale yellow. Take vitamin and mineral supplements as recommended by your health care provider. Do not drink alcohol if your health care provider tells you not to drink. If you drink alcohol: Limit how much you have to 0-2 drinks a day. Know how much alcohol is in your drink. In the [place] one drink equals one 12 oz bottle of beer (355 mL), one 5 oz glass of wine (148 mL), or one 1 oz glass of hard liquor (44 mL). Lifestyle Brush your teeth every morning and night with fluoride toothpaste. Floss one time each day. Exercise for at least 30 minutes 5 or more days each week. Do not use any products that contain nicotine or tobacco. These products include cigarettes, chewing tobacco, and vaping devices, such as e-cigarettes. If you need help quitting, ask your health care provider. Do not use drugs. If you are sexually active, practice safe sex. Use a condom or other form of protection to prevent STIs. Find healthy ways to manage stress, such as: Meditation, yoga, or listening to music. Journaling. Talking to a trusted person. Spending time with friends and family. Minimize exposure to UV radiation to reduce your risk of skin cancer. Safety Always wear your seat belt while driving or riding in a vehicle. Do not drive: If you have been drinking alcohol. Do not ride with someone who has been drinking. If you have been using any mind-altering substances   or drugs. While texting. When you are tired or distracted. Wear a helmet and other protective equipment during sports activities. If you have firearms in your house, make sure you  follow all gun safety procedures. Seek help if you have been physically or sexually abused. What's next? Go to your health care provider once a year for an annual wellness visit. Ask your health care provider how often you should have your eyes and teeth checked. Stay up to date on all vaccines. This information is not intended to replace advice given to you by your health care provider. Make sure you discuss any questions you have with your health care provider. Document Revised: 11/11/2020 Document Reviewed: 11/11/2020 Elsevier Patient Education  2024 Elsevier Inc.  

## 2022-12-27 NOTE — Progress Notes (Signed)
Pt states stating shortness of breath last week, and still kind of experiencing it.

## 2023-02-04 ENCOUNTER — Other Ambulatory Visit: Payer: Self-pay

## 2023-02-04 ENCOUNTER — Encounter (HOSPITAL_COMMUNITY): Payer: Self-pay | Admitting: Emergency Medicine

## 2023-02-04 ENCOUNTER — Emergency Department (HOSPITAL_COMMUNITY)
Admission: EM | Admit: 2023-02-04 | Discharge: 2023-02-04 | Disposition: A | Discharge status: Home / Self Care | Payer: Medicare Other | Attending: Emergency Medicine | Admitting: Emergency Medicine

## 2023-02-04 DIAGNOSIS — R350 Frequency of micturition: Secondary | ICD-10-CM | POA: Diagnosis present

## 2023-02-04 LAB — URINALYSIS, ROUTINE W REFLEX MICROSCOPIC
Bilirubin Urine: NEGATIVE
Glucose, UA: NEGATIVE mg/dL
Hgb urine dipstick: NEGATIVE
Ketones, ur: NEGATIVE mg/dL
Leukocytes,Ua: NEGATIVE
Nitrite: NEGATIVE
Protein, ur: NEGATIVE mg/dL
Specific Gravity, Urine: 1.004 — ABNORMAL LOW (ref 1.005–1.030)
pH: 8 (ref 5.0–8.0)

## 2023-02-04 LAB — CBC WITH DIFFERENTIAL/PLATELET
Abs Immature Granulocytes: 0.02 10*3/uL (ref 0.00–0.07)
Basophils Absolute: 0 10*3/uL (ref 0.0–0.1)
Basophils Relative: 0 %
Eosinophils Absolute: 0 10*3/uL (ref 0.0–0.5)
Eosinophils Relative: 0 %
HCT: 50.8 % (ref 39.0–52.0)
Hemoglobin: 17.5 g/dL — ABNORMAL HIGH (ref 13.0–17.0)
Immature Granulocytes: 0 %
Lymphocytes Relative: 29 %
Lymphs Abs: 2.3 10*3/uL (ref 0.7–4.0)
MCH: 30.6 pg (ref 26.0–34.0)
MCHC: 34.4 g/dL (ref 30.0–36.0)
MCV: 88.8 fL (ref 80.0–100.0)
Monocytes Absolute: 0.4 10*3/uL (ref 0.1–1.0)
Monocytes Relative: 5 %
Neutro Abs: 5.1 10*3/uL (ref 1.7–7.7)
Neutrophils Relative %: 66 %
Platelets: 241 10*3/uL (ref 150–400)
RBC: 5.72 MIL/uL (ref 4.22–5.81)
RDW: 12.4 % (ref 11.5–15.5)
WBC: 7.9 10*3/uL (ref 4.0–10.5)
nRBC: 0 % (ref 0.0–0.2)

## 2023-02-04 LAB — COMPREHENSIVE METABOLIC PANEL
ALT: 15 U/L (ref 0–44)
AST: 18 U/L (ref 15–41)
Albumin: 4.9 g/dL (ref 3.5–5.0)
Alkaline Phosphatase: 75 U/L (ref 38–126)
Anion gap: 12 (ref 5–15)
BUN: 7 mg/dL (ref 6–20)
CO2: 25 mmol/L (ref 22–32)
Calcium: 10.1 mg/dL (ref 8.9–10.3)
Chloride: 102 mmol/L (ref 98–111)
Creatinine, Ser: 0.72 mg/dL (ref 0.61–1.24)
GFR, Estimated: >60 mL/min (ref >60)
Glucose, Bld: 91 mg/dL (ref 70–99)
Potassium: 4.4 mmol/L (ref 3.5–5.1)
Sodium: 139 mmol/L (ref 135–145)
Total Bilirubin: 0.6 mg/dL (ref 0.3–1.2)
Total Protein: 8.2 g/dL — ABNORMAL HIGH (ref 6.5–8.1)

## 2023-02-04 NOTE — ED Triage Notes (Signed)
Pt reports suprapubic pain and frequent urination. Denies dysuria. Pt also reports right sided back pain.

## 2023-02-04 NOTE — ED Provider Notes (Signed)
  [place] EMERGENCY DEPARTMENT AT [facility] Provider Note   CSN: [id number] Arrival date & time: 02/04/23  1600     History {Add pertinent medical, surgical, social history, OB history to HPI:1} Chief Complaint  Patient presents with   Abdominal Pain    Justin Mosley is a 32 y.o. male.  Patient with schizoaffective disorder.  Patient states he had some lower abdominal discomfort and urinary frequency but this has improved   Abdominal Pain      Home Medications Prior to Admission medications   Medication Sig Start Date End Date Taking? Authorizing Provider  FLUoxetine (PROZAC) 10 MG capsule Take 1 capsule (10 mg total) by mouth in the morning. 12/22/22   Carrion-Carrero, Karle Starch, MD  prazosin (MINIPRESS) 1 MG capsule Take 1 capsule (1 mg total) by mouth at bedtime. 12/22/22   Carrion-Carrero, Karle Starch, MD  QUEtiapine (SEROQUEL) 100 MG tablet Take 1 tablet (100 mg total) by mouth daily. 12/22/22   Carrion-Carrero, Karle Starch, MD      Allergies    Coconut (cocos nucifera) and Lamictal [lamotrigine]    Review of Systems   Review of Systems  Gastrointestinal:  Positive for abdominal pain.    Physical Exam Updated Vital Signs BP (!) 140/97   Pulse 78   Temp 98 F (36.7 C) (Oral)   Resp 17   SpO2 100%  Physical Exam  ED Results / Procedures / Treatments   Labs (all labs ordered are listed, but only abnormal results are displayed) Labs Reviewed  URINALYSIS, ROUTINE W REFLEX MICROSCOPIC - Abnormal; Notable for the following components:      Result Value   Color, Urine COLORLESS (*)    Specific Gravity, Urine 1.004 (*)    All other components within normal limits  CBC WITH DIFFERENTIAL/PLATELET - Abnormal; Notable for the following components:   Hemoglobin 17.5 (*)    All other components within normal limits  COMPREHENSIVE METABOLIC PANEL - Abnormal; Notable for the following components:   Total Protein 8.2 (*)    All other components within normal limits   URINE CULTURE    EKG None  Radiology No results found.  Procedures Procedures  {Document cardiac monitor, telemetry assessment procedure when appropriate:1}  Medications Ordered in ED Medications - No data to display  ED Course/ Medical Decision Making/ A&P   {   Click here for ABCD2, HEART and other calculatorsREFRESH Note before signing :1}                              Medical Decision Making Amount and/or Complexity of Data Reviewed Labs: ordered.   Patient with urinary frequency and suprapubic discomfort.  Symptoms have improved.  Urinalysis is unremarkable.  The urine will be cultured and the patient will follow-up with urology if any more symptoms.  Patient suspects he drink a lot of fluids and that caused his urinary frequency with discomfort  {Document critical care time when appropriate:1} {Document review of labs and clinical decision tools ie heart score, Chads2Vasc2 etc:1}  {Document your independent review of radiology images, and any outside records:1} {Document your discussion with family members, caretakers, and with consultants:1} {Document social determinants of health affecting pt's care:1} {Document your decision making why or why not admission, treatments were needed:1} Final Clinical Impression(s) / ED Diagnoses Final diagnoses:  Urinary frequency    Rx / DC Orders ED Discharge Orders     None

## 2023-02-04 NOTE — ED Notes (Signed)
Lab called and informed of need for urine culture.  Test to be added

## 2023-02-04 NOTE — Discharge Instructions (Signed)
For follow-up with [facility] if any more problems with urination

## 2023-02-06 LAB — URINE CULTURE: Culture: NO GROWTH
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
## 2023-02-22 ENCOUNTER — Encounter (HOSPITAL_COMMUNITY): Payer: Self-pay | Admitting: Student in an Organized Health Care Education/Training Program

## 2023-02-22 ENCOUNTER — Ambulatory Visit (INDEPENDENT_AMBULATORY_CARE_PROVIDER_SITE_OTHER): Payer: Medicare Other | Admitting: Student in an Organized Health Care Education/Training Program

## 2023-02-22 DIAGNOSIS — Z79899 Other long term (current) drug therapy: Secondary | ICD-10-CM | POA: Diagnosis not present

## 2023-02-22 DIAGNOSIS — F4310 Post-traumatic stress disorder, unspecified: Secondary | ICD-10-CM | POA: Diagnosis not present

## 2023-02-22 DIAGNOSIS — F411 Generalized anxiety disorder: Secondary | ICD-10-CM

## 2023-02-22 DIAGNOSIS — F250 Schizoaffective disorder, bipolar type: Secondary | ICD-10-CM | POA: Diagnosis not present

## 2023-02-22 MED ORDER — PRAZOSIN HCL 1 MG PO CAPS
1.0000 mg | ORAL_CAPSULE | Freq: Every day | ORAL | 3 refills | Status: DC
Start: 2023-02-22 — End: 2023-04-26

## 2023-02-22 MED ORDER — QUETIAPINE FUMARATE 200 MG PO TABS
200.0000 mg | ORAL_TABLET | Freq: Every evening | ORAL | 0 refills | Status: DC
Start: 2023-02-22 — End: 2023-03-22

## 2023-02-22 MED ORDER — FLUOXETINE HCL 10 MG PO CAPS
10.0000 mg | ORAL_CAPSULE | Freq: Every morning | ORAL | 3 refills | Status: DC
Start: 2023-02-22 — End: 2023-03-22

## 2023-02-22 NOTE — Patient Instructions (Signed)
[facility] Patient Name: Justin Mosley Date of Visit: [02/22/23  Provider Name: Dr. Theodis Aguas   Dear Justin Mosley, it was a pleasure seeing you today, and we appreciate the opportunity to care for you.   Below is a brief summary of things we discussed:  We discussed your progress since the last visit. Your sleep, depression, and hallucinations have significantly improved We still need to work on your symptoms of paranoia that keep you from leaving your home.    These are our shared goals we should aim for by our next visit: Improved paranoia, and that you are able to go outside of your without feeling so much anxiety Please follow up with you PCP about some of the heart-burn symptoms you told me about.    These are the changes we have made to your medications:  We increased you Seroquel from 100 mg to 200 mg a night for better control of your anxiety and paranoia.   Lifestyle Recommendations Sleep Hygiene: Aim for 7-9 hours of quality sleep each night. Establish a regular sleep routine and create a restful environment. Stress Management: Practice stress-reducing techniques such as mindfulness, meditation, deep breathing exercises, or hobbies that you enjoy. Smoking Cessation: If you smoke, consider quitting. We can provide resources and support to help you. Alcohol Consumption: Limit alcohol intake to moderate levels--up to one drink per day for women and up to two drinks per day for men. Routine Health Screenings: Stay up-to-date with routine health screenings and vaccinations as recommended.

## 2023-02-24 DIAGNOSIS — Z79899 Other long term (current) drug therapy: Secondary | ICD-10-CM | POA: Insufficient documentation

## 2023-02-24 NOTE — Addendum Note (Signed)
Addended by: Tia Masker on: 02/24/2023 02:01 PM   Modules accepted: Level of Service
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
## 2023-03-22 ENCOUNTER — Ambulatory Visit (HOSPITAL_COMMUNITY): Payer: Medicare Other | Admitting: Student in an Organized Health Care Education/Training Program

## 2023-03-22 ENCOUNTER — Encounter (HOSPITAL_COMMUNITY): Payer: Self-pay | Admitting: Student in an Organized Health Care Education/Training Program

## 2023-03-22 VITALS — BP 145/101 | HR 65 | Temp 97.8°F | Wt 132.0 lb

## 2023-03-22 DIAGNOSIS — Z79899 Other long term (current) drug therapy: Secondary | ICD-10-CM

## 2023-03-22 DIAGNOSIS — F250 Schizoaffective disorder, bipolar type: Secondary | ICD-10-CM

## 2023-03-22 DIAGNOSIS — F4310 Post-traumatic stress disorder, unspecified: Secondary | ICD-10-CM

## 2023-03-22 DIAGNOSIS — F411 Generalized anxiety disorder: Secondary | ICD-10-CM | POA: Diagnosis not present

## 2023-03-22 MED ORDER — FLUOXETINE HCL 10 MG PO CAPS
10.0000 mg | ORAL_CAPSULE | Freq: Every morning | ORAL | 0 refills | Status: DC
Start: 2023-03-31 — End: 2023-04-26

## 2023-03-22 MED ORDER — QUETIAPINE FUMARATE 200 MG PO TABS
200.0000 mg | ORAL_TABLET | Freq: Every evening | ORAL | 0 refills | Status: DC
Start: 2023-03-31 — End: 2023-04-26

## 2023-03-23 NOTE — Addendum Note (Signed)
Addended by: Tia Masker on: 03/23/2023 04:40 PM   Modules accepted: Level of Service

## 2023-04-17 ENCOUNTER — Ambulatory Visit: Payer: Medicare Other | Admitting: Internal Medicine
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
## 2023-04-26 ENCOUNTER — Encounter (HOSPITAL_COMMUNITY): Payer: Self-pay | Admitting: Student in an Organized Health Care Education/Training Program

## 2023-04-26 ENCOUNTER — Ambulatory Visit (INDEPENDENT_AMBULATORY_CARE_PROVIDER_SITE_OTHER): Payer: Medicare Other | Admitting: Student in an Organized Health Care Education/Training Program

## 2023-04-26 DIAGNOSIS — F250 Schizoaffective disorder, bipolar type: Secondary | ICD-10-CM | POA: Diagnosis not present

## 2023-04-26 DIAGNOSIS — F411 Generalized anxiety disorder: Secondary | ICD-10-CM

## 2023-04-26 DIAGNOSIS — F4310 Post-traumatic stress disorder, unspecified: Secondary | ICD-10-CM | POA: Diagnosis not present

## 2023-04-26 MED ORDER — QUETIAPINE FUMARATE 300 MG PO TABS
300.0000 mg | ORAL_TABLET | Freq: Every evening | ORAL | 1 refills | Status: DC
Start: 2023-04-26 — End: 2023-06-21

## 2023-04-26 MED ORDER — PRAZOSIN HCL 1 MG PO CAPS
1.0000 mg | ORAL_CAPSULE | Freq: Every day | ORAL | 1 refills | Status: DC
Start: 2023-04-26 — End: 2023-06-21

## 2023-05-04 NOTE — Addendum Note (Signed)
Addended by: Theodoro Kos A on: 05/04/2023 02:40 PM   Modules accepted: Level of Service

## 2023-05-23 ENCOUNTER — Ambulatory Visit: Payer: Medicare Other | Attending: Cardiovascular Disease | Admitting: Cardiovascular Disease

## 2023-06-21 ENCOUNTER — Telehealth (HOSPITAL_COMMUNITY): Payer: Self-pay | Admitting: Student in an Organized Health Care Education/Training Program

## 2023-06-21 ENCOUNTER — Encounter (HOSPITAL_COMMUNITY): Payer: Medicare Other | Admitting: Student in an Organized Health Care Education/Training Program

## 2023-06-21 ENCOUNTER — Encounter (HOSPITAL_COMMUNITY): Payer: Self-pay

## 2023-06-21 DIAGNOSIS — F4310 Post-traumatic stress disorder, unspecified: Secondary | ICD-10-CM

## 2023-06-21 DIAGNOSIS — F250 Schizoaffective disorder, bipolar type: Secondary | ICD-10-CM

## 2023-06-21 DIAGNOSIS — F411 Generalized anxiety disorder: Secondary | ICD-10-CM

## 2023-06-21 MED ORDER — PRAZOSIN HCL 1 MG PO CAPS
1.0000 mg | ORAL_CAPSULE | Freq: Every day | ORAL | 1 refills | Status: DC
Start: 2023-06-21 — End: 2023-09-27

## 2023-06-21 MED ORDER — QUETIAPINE FUMARATE 300 MG PO TABS
300.0000 mg | ORAL_TABLET | Freq: Every evening | ORAL | 1 refills | Status: DC
Start: 2023-06-21 — End: 2023-09-27

## 2023-06-21 NOTE — Telephone Encounter (Signed)
Was informed that patient's appointment was rescheduled and would require bridge medications until his next appointment.   The following medications were sent to patient's pharmacy: -Prazosin 1 mg nightly 30 tablets, 1 refill -Seroquel 300 mg nightly 30 tablets, 1 refill   Dr. Liston Alba, MD PGY-2, Psychiatry Residency

## 2023-06-21 NOTE — Progress Notes (Deleted)
Patient was not seen on 06/20/2022, his appointment was rescheduled for a later day.
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
## 2023-07-26 ENCOUNTER — Encounter (HOSPITAL_COMMUNITY): Payer: Medicare Other | Admitting: Student in an Organized Health Care Education/Training Program

## 2023-07-28 NOTE — Progress Notes (Signed)
 This encounter was created in error - please disregard.

## 2023-09-25 ENCOUNTER — Telehealth (HOSPITAL_COMMUNITY): Payer: Self-pay

## 2023-09-25 ENCOUNTER — Encounter (HOSPITAL_COMMUNITY): Payer: Self-pay | Admitting: Student in an Organized Health Care Education/Training Program

## 2023-09-25 NOTE — Telephone Encounter (Signed)
 Good Morning,   The patient is requesting a phone call regarding his care/medication. Could you please contact the patient.  Thank you,   Rosie

## 2023-09-25 NOTE — Telephone Encounter (Signed)
 I called the patient at around 1:20 PM, left a HIPAA compliant voicemail. I'll try reaching out again tomorrow and leave a mychart message.

## 2023-09-25 NOTE — Telephone Encounter (Deleted)
 I called the patient at around 1:20 PM, left a HIPAA compliant voicemail. I'll try reaching out again tomorrow and leave a mychart message.

## 2023-09-26 ENCOUNTER — Ambulatory Visit

## 2023-09-27 ENCOUNTER — Other Ambulatory Visit (HOSPITAL_COMMUNITY): Payer: Self-pay | Admitting: Student in an Organized Health Care Education/Training Program

## 2023-09-27 DIAGNOSIS — F4310 Post-traumatic stress disorder, unspecified: Secondary | ICD-10-CM

## 2023-09-27 DIAGNOSIS — F250 Schizoaffective disorder, bipolar type: Secondary | ICD-10-CM

## 2023-09-27 DIAGNOSIS — F411 Generalized anxiety disorder: Secondary | ICD-10-CM

## 2023-09-27 MED ORDER — QUETIAPINE FUMARATE 300 MG PO TABS
300.0000 mg | ORAL_TABLET | Freq: Every evening | ORAL | 1 refills | Status: DC
Start: 2023-09-27 — End: 2023-11-27

## 2023-09-27 MED ORDER — PRAZOSIN HCL 1 MG PO CAPS: 1.0000 mg | ORAL_CAPSULE | Freq: Every day | ORAL | 1 refills | Status: DC

## 2023-09-27 NOTE — Progress Notes (Signed)
 A refill of patient's medications have been electronically sent to his pharmacy on 09/27/23  to bridge him until his follow up appointment in June.    The following medications were sent to patient's pharmacy: -Prazosin  1 mg nightly 30 tablets, 1 refill -Seroquel  300 mg nightly 30 tablets, 1 refill   Dr. Derl Flemings, MD PGY-2, Psychiatry Residency
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
## 2023-11-27 ENCOUNTER — Encounter (HOSPITAL_COMMUNITY): Payer: Self-pay | Admitting: Psychiatry

## 2023-11-27 ENCOUNTER — Telehealth (HOSPITAL_COMMUNITY): Admitting: Psychiatry

## 2023-11-27 DIAGNOSIS — F410 Panic disorder [episodic paroxysmal anxiety] without agoraphobia: Secondary | ICD-10-CM

## 2023-11-27 DIAGNOSIS — F250 Schizoaffective disorder, bipolar type: Secondary | ICD-10-CM | POA: Diagnosis not present

## 2023-11-27 DIAGNOSIS — F4310 Post-traumatic stress disorder, unspecified: Secondary | ICD-10-CM | POA: Diagnosis not present

## 2023-11-27 DIAGNOSIS — F411 Generalized anxiety disorder: Secondary | ICD-10-CM

## 2023-11-27 MED ORDER — PRAZOSIN HCL 1 MG PO CAPS: 1.0000 mg | ORAL_CAPSULE | Freq: Every day | ORAL | 1 refills | Status: DC

## 2023-11-27 MED ORDER — QUETIAPINE FUMARATE 300 MG PO TABS
300.0000 mg | ORAL_TABLET | Freq: Every evening | ORAL | 1 refills | Status: DC
Start: 2023-11-27 — End: 2023-12-25

## 2023-11-27 NOTE — Patient Instructions (Addendum)
 Thank you for attending your appointment today.  -- We did not make any medication changes today. Please continue medications as prescribed. -- The resource for therapy we discussed today is: www.psychologytoday.com      You can go to this website and search for therapists with a filter for location, insurance, therapy type, etc.  Please do not make any changes to medications without first discussing with your provider. If you are experiencing a psychiatric emergency, please call 911 or present to your nearest emergency department. Additional crisis, medication management, and therapy resources are included below.  [facility]  [address], [place], [place] [address] [phone] WALK-IN URGENT CARE 24/7 FOR ANYONE [address], [place], [place]  [phone] Fax: [phone] [email or web address] *Interpreters available *Accepts all insurance and uninsured for Urgent Care needs *Accepts Medicaid and uninsured for outpatient treatment (below)      ONLY FOR [facility]  Below:    Outpatient New Patient Assessment/Therapy Walk-ins:        Monday, Wednesday, and Thursday 8am until slots are full (first come, first served)                   New Patient Psychiatry/Medication Management        Monday-Friday 8am-11am (first come, first served)               For all walk-ins we ask that you arrive by 7:15am, because patients will be seen in the order of arrival.
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
## 2023-12-25 ENCOUNTER — Telehealth (HOSPITAL_COMMUNITY): Admitting: Psychiatry

## 2023-12-25 ENCOUNTER — Encounter (HOSPITAL_COMMUNITY): Payer: Self-pay | Admitting: Psychiatry

## 2023-12-25 DIAGNOSIS — F4310 Post-traumatic stress disorder, unspecified: Secondary | ICD-10-CM | POA: Diagnosis not present

## 2023-12-25 DIAGNOSIS — F250 Schizoaffective disorder, bipolar type: Secondary | ICD-10-CM

## 2023-12-25 DIAGNOSIS — F411 Generalized anxiety disorder: Secondary | ICD-10-CM

## 2023-12-25 DIAGNOSIS — F410 Panic disorder [episodic paroxysmal anxiety] without agoraphobia: Secondary | ICD-10-CM

## 2023-12-25 MED ORDER — QUETIAPINE FUMARATE 300 MG PO TABS: 300.0000 mg | ORAL_TABLET | Freq: Every day | ORAL | 1 refills | Status: DC

## 2023-12-25 MED ORDER — QUETIAPINE FUMARATE 100 MG PO TABS: 100.0000 mg | ORAL_TABLET | Freq: Every morning | ORAL | 1 refills | Status: DC

## 2023-12-25 MED ORDER — PRAZOSIN HCL 1 MG PO CAPS: 1.0000 mg | ORAL_CAPSULE | Freq: Every day | ORAL | 1 refills | Status: DC

## 2023-12-25 NOTE — Patient Instructions (Signed)
 Thank you for attending your appointment today.  -- INCREASE Seroquel  to 100 mg in the morning + 300 mg nightly -- Continue other medications as prescribed.  Please do not make any changes to medications without first discussing with your provider. If you are experiencing a psychiatric emergency, please call 911 or present to your nearest emergency department. Additional crisis, medication management, and therapy resources are included below.  [facility]  [address] [place], [place] [address] [phone] WALK-IN URGENT CARE 24/7 FOR ANYONE [address], [place], [place]  [phone] Fax: [phone] [email or web address] *Interpreters available *Accepts all insurance and uninsured for Urgent Care needs *Accepts Medicaid and uninsured for outpatient treatment (below)      ONLY FOR [facility]  Below:    Outpatient New Patient Assessment/Therapy Walk-ins:        Monday, Wednesday, and Thursday 8am until slots are full (first come, first served)                   New Patient Psychiatry/Medication Management        Monday-Friday 8am-11am (first come, first served)               For all walk-ins we ask that you arrive by 7:15am, because patients will be seen in the order of arrival.

## 2024-01-19 ENCOUNTER — Telehealth (HOSPITAL_COMMUNITY): Payer: Self-pay | Admitting: Psychiatry

## 2024-01-19 NOTE — Telephone Encounter (Signed)
 Called Delphin for scheduled phone check in - spoke for 12 minutes:  He reports he is doing okay - states sleep has been better with about 6-8 hours nightly. Sleeping a bit more during the day but notes this may be related to delayed sleep schedule and not sure if this is from addition of daytime Seroquel .  Mood most days is in the middle with occasional moments of deep depression although these are not as deep as before.  Appetite is better although has to remind himself to eat - 1 episode in which he feared food was being poisoned when he went to [facility] once but denies outside of this and eating well at home. AH have been a bit better and not as bothersome. Denies SI/HI.   Denies any other adverse effects to Seroquel . Given recent improvements, amenable to continuing medications as prescribed. Remains contemplative regarding getting connected in therapy; reminded of psychologytoday.com resource. No questions/concerns at this time.  LAURAINE DELENA PUMMEL, MD 01/19/24

## 2024-01-21 ENCOUNTER — Other Ambulatory Visit (HOSPITAL_COMMUNITY): Payer: Self-pay | Admitting: Psychiatry
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
## 2024-02-12 ENCOUNTER — Telehealth (HOSPITAL_COMMUNITY): Admitting: Psychiatry

## 2024-02-12 ENCOUNTER — Encounter (HOSPITAL_COMMUNITY): Payer: Self-pay | Admitting: Psychiatry

## 2024-02-12 DIAGNOSIS — F411 Generalized anxiety disorder: Secondary | ICD-10-CM | POA: Diagnosis not present

## 2024-02-12 DIAGNOSIS — F250 Schizoaffective disorder, bipolar type: Secondary | ICD-10-CM

## 2024-02-12 DIAGNOSIS — F4310 Post-traumatic stress disorder, unspecified: Secondary | ICD-10-CM

## 2024-02-12 DIAGNOSIS — F410 Panic disorder [episodic paroxysmal anxiety] without agoraphobia: Secondary | ICD-10-CM

## 2024-02-12 MED ORDER — PRAZOSIN HCL 1 MG PO CAPS: 1.0000 mg | ORAL_CAPSULE | Freq: Every day | ORAL | 2 refills | Status: DC

## 2024-02-12 MED ORDER — QUETIAPINE FUMARATE 300 MG PO TABS: 300.0000 mg | ORAL_TABLET | Freq: Every day | ORAL | 2 refills | Status: DC

## 2024-02-12 MED ORDER — QUETIAPINE FUMARATE 100 MG PO TABS: 100.0000 mg | ORAL_TABLET | Freq: Every morning | ORAL | 2 refills | Status: DC

## 2024-02-12 NOTE — Patient Instructions (Signed)

## 2024-02-26 ENCOUNTER — Telehealth: Admitting: Physician Assistant

## 2024-02-26 DIAGNOSIS — J020 Streptococcal pharyngitis: Secondary | ICD-10-CM

## 2024-02-27 MED ORDER — AMOXICILLIN 500 MG PO TABS
500.0000 mg | ORAL_TABLET | Freq: Two times a day (BID) | ORAL | 0 refills | Status: AC
Start: 2024-02-27 — End: 2024-03-08

## 2024-02-27 NOTE — Progress Notes (Signed)

## 2024-02-27 NOTE — Progress Notes (Signed)
 Message sent to patient requesting further input regarding current symptoms. Awaiting patient response.

## 2024-02-27 NOTE — Progress Notes (Signed)
 I have spent 5 minutes in review of e-visit questionnaire, review and updating patient chart, medical decision making and response to patient.   Elsie Velma Lunger, PA-C
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
## 2024-04-10 ENCOUNTER — Encounter (HOSPITAL_COMMUNITY): Payer: Self-pay | Admitting: Psychiatry

## 2024-04-10 ENCOUNTER — Telehealth (INDEPENDENT_AMBULATORY_CARE_PROVIDER_SITE_OTHER): Admitting: Psychiatry

## 2024-04-10 DIAGNOSIS — F4310 Post-traumatic stress disorder, unspecified: Secondary | ICD-10-CM | POA: Diagnosis not present

## 2024-04-10 DIAGNOSIS — F410 Panic disorder [episodic paroxysmal anxiety] without agoraphobia: Secondary | ICD-10-CM | POA: Diagnosis not present

## 2024-04-10 DIAGNOSIS — F411 Generalized anxiety disorder: Secondary | ICD-10-CM | POA: Diagnosis not present

## 2024-04-10 DIAGNOSIS — F250 Schizoaffective disorder, bipolar type: Secondary | ICD-10-CM | POA: Diagnosis not present

## 2024-04-10 MED ORDER — QUETIAPINE FUMARATE 300 MG PO TABS: 300.0000 mg | ORAL_TABLET | Freq: Every day | ORAL | 3 refills | Status: AC

## 2024-04-10 MED ORDER — PRAZOSIN HCL 1 MG PO CAPS: 1.0000 mg | ORAL_CAPSULE | Freq: Every day | ORAL | 3 refills | Status: AC

## 2024-04-10 MED ORDER — QUETIAPINE FUMARATE 100 MG PO TABS: 100.0000 mg | ORAL_TABLET | Freq: Every morning | ORAL | 3 refills | Status: AC

## 2024-04-10 NOTE — Patient Instructions (Addendum)
 Thank you for attending your appointment today. It has been such a pleasure working with you!  -- We did not make any medication changes today. Please continue medications as prescribed. -- Your next appointment is scheduled with Dr. Cecily 06/12/24 at 3PM. We will have this moved to be in person. Here is the information for the [place] office: [facility] Address: [address], [place], [place] [address] [phone]  Please do not make any changes to medications without first discussing with your provider. If you are experiencing a psychiatric emergency, please call 911 or present to your nearest emergency department. Additional crisis, medication management, and therapy resources are included below.  [facility]  [address] [place], [place] [address] [phone] WALK-IN URGENT CARE 24/7 FOR ANYONE [address], [place], [place]  [phone] Fax: [phone] [email or web address] *Interpreters available *Accepts all insurance and uninsured for Urgent Care needs *Accepts Medicaid and uninsured for outpatient treatment (below)      ONLY FOR [facility]  Below:    Outpatient New Patient Assessment/Therapy Walk-ins:        Monday, Wednesday, and Thursday 8am until slots are full (first come, first served)                   New Patient Psychiatry/Medication Management        Monday-Friday 8am-11am (first come, first served)               For all walk-ins we ask that you arrive by 7:15am, because patients will be seen in the order of arrival.  Thank you for attending your appointment today.  -- We did not make any medication changes today. Please continue medications as prescribed.  Please do not make any changes to medications without first discussing with your provider. If you are experiencing a psychiatric emergency, please call 911 or present to your nearest emergency department. Additional  crisis, medication management, and therapy resources are included below.  [facility]  [address], [place], [place] [address] [phone] WALK-IN URGENT CARE 24/7 FOR ANYONE [address] [place], [place]  [phone] Fax: [phone] [email or web address] *Interpreters available *Accepts all insurance and uninsured for Urgent Care needs *Accepts Medicaid and uninsured for outpatient treatment (below)      ONLY FOR [facility]  Below:    Outpatient New Patient Assessment/Therapy Walk-ins:        Monday, Wednesday, and Thursday 8am until slots are full (first come, first served)                   New Patient Psychiatry/Medication Management        Monday-Friday 8am-11am (first come, first served)               For all walk-ins we ask that you arrive by 7:15am, because patients will be seen in the order of arrival.

## 2024-06-12 ENCOUNTER — Ambulatory Visit (HOSPITAL_BASED_OUTPATIENT_CLINIC_OR_DEPARTMENT_OTHER): Admitting: Student in an Organized Health Care Education/Training Program

## 2024-06-12 ENCOUNTER — Encounter (HOSPITAL_COMMUNITY): Payer: Self-pay | Admitting: Student in an Organized Health Care Education/Training Program

## 2024-06-12 VITALS — BP 129/81 | HR 85 | Ht 66.5 in | Wt 134.8 lb

## 2024-06-12 DIAGNOSIS — F410 Panic disorder [episodic paroxysmal anxiety] without agoraphobia: Secondary | ICD-10-CM | POA: Diagnosis not present

## 2024-06-12 DIAGNOSIS — F411 Generalized anxiety disorder: Secondary | ICD-10-CM

## 2024-06-12 DIAGNOSIS — F250 Schizoaffective disorder, bipolar type: Secondary | ICD-10-CM
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
## 2024-07-24 ENCOUNTER — Ambulatory Visit (HOSPITAL_COMMUNITY): Admitting: Student in an Organized Health Care Education/Training Program
